# Patient Record
Sex: Female | Born: 1960 | Race: White | Hispanic: No | Marital: Married | Smoking: Former smoker
Health system: Southern US, Community
[De-identification: ages and names within clinical notes are randomized; demographics above are authoritative.]

## PROBLEM LIST (undated history)

## (undated) DIAGNOSIS — R700 Elevated erythrocyte sedimentation rate: Secondary | ICD-10-CM

## (undated) DIAGNOSIS — J45909 Unspecified asthma, uncomplicated: Secondary | ICD-10-CM

## (undated) DIAGNOSIS — D126 Benign neoplasm of colon, unspecified: Secondary | ICD-10-CM

## (undated) DIAGNOSIS — Z5189 Encounter for other specified aftercare: Secondary | ICD-10-CM

## (undated) DIAGNOSIS — N200 Calculus of kidney: Secondary | ICD-10-CM

## (undated) DIAGNOSIS — M8580 Other specified disorders of bone density and structure, unspecified site: Secondary | ICD-10-CM

## (undated) DIAGNOSIS — R238 Other skin changes: Secondary | ICD-10-CM

## (undated) DIAGNOSIS — D649 Anemia, unspecified: Secondary | ICD-10-CM

## (undated) DIAGNOSIS — T7840XA Allergy, unspecified, initial encounter: Secondary | ICD-10-CM

## (undated) DIAGNOSIS — Z862 Personal history of diseases of the blood and blood-forming organs and certain disorders involving the immune mechanism: Secondary | ICD-10-CM

## (undated) DIAGNOSIS — E54 Ascorbic acid deficiency: Secondary | ICD-10-CM

## (undated) DIAGNOSIS — Z87442 Personal history of urinary calculi: Secondary | ICD-10-CM

## (undated) DIAGNOSIS — IMO0002 Reserved for concepts with insufficient information to code with codable children: Secondary | ICD-10-CM

## (undated) DIAGNOSIS — Z8041 Family history of malignant neoplasm of ovary: Secondary | ICD-10-CM

## (undated) DIAGNOSIS — M8700 Idiopathic aseptic necrosis of unspecified bone: Secondary | ICD-10-CM

## (undated) DIAGNOSIS — B019 Varicella without complication: Secondary | ICD-10-CM

## (undated) DIAGNOSIS — M797 Fibromyalgia: Secondary | ICD-10-CM

## (undated) DIAGNOSIS — E559 Vitamin D deficiency, unspecified: Secondary | ICD-10-CM

## (undated) DIAGNOSIS — R233 Spontaneous ecchymoses: Secondary | ICD-10-CM

## (undated) DIAGNOSIS — Z9884 Bariatric surgery status: Secondary | ICD-10-CM

## (undated) DIAGNOSIS — L309 Dermatitis, unspecified: Secondary | ICD-10-CM

## (undated) DIAGNOSIS — E611 Iron deficiency: Secondary | ICD-10-CM

## (undated) DIAGNOSIS — K219 Gastro-esophageal reflux disease without esophagitis: Secondary | ICD-10-CM

## (undated) HISTORY — DX: Spontaneous ecchymoses: R23.3

## (undated) HISTORY — DX: Other specified disorders of bone density and structure, unspecified site: M85.80

## (undated) HISTORY — PX: APPENDECTOMY: SHX54

## (undated) HISTORY — DX: Dermatitis, unspecified: L30.9

## (undated) HISTORY — DX: Bariatric surgery status: Z98.84

## (undated) HISTORY — DX: Anemia, unspecified: D64.9

## (undated) HISTORY — DX: Reserved for concepts with insufficient information to code with codable children: IMO0002

## (undated) HISTORY — DX: Gastro-esophageal reflux disease without esophagitis: K21.9

## (undated) HISTORY — PX: DENTAL SURGERY: SHX609

## (undated) HISTORY — DX: Vitamin D deficiency, unspecified: E55.9

## (undated) HISTORY — DX: Iron deficiency: E61.1

## (undated) HISTORY — DX: Varicella without complication: B01.9

## (undated) HISTORY — DX: Unspecified asthma, uncomplicated: J45.909

## (undated) HISTORY — DX: Ascorbic acid deficiency: E54

## (undated) HISTORY — DX: Family history of malignant neoplasm of ovary: Z80.41

## (undated) HISTORY — DX: Encounter for other specified aftercare: Z51.89

## (undated) HISTORY — DX: Calculus of kidney: N20.0

## (undated) HISTORY — DX: Personal history of diseases of the blood and blood-forming organs and certain disorders involving the immune mechanism: Z86.2

## (undated) HISTORY — DX: Benign neoplasm of colon, unspecified: D12.6

## (undated) HISTORY — DX: Elevated erythrocyte sedimentation rate: R70.0

## (undated) HISTORY — DX: Allergy, unspecified, initial encounter: T78.40XA

## (undated) HISTORY — DX: Other skin changes: R23.8

## (undated) HISTORY — DX: Idiopathic aseptic necrosis of unspecified bone: M87.00

---

## 1995-12-07 HISTORY — PX: BREAST BIOPSY: SHX20

## 1998-12-06 HISTORY — PX: ABDOMINAL HYSTERECTOMY: SHX81

## 1999-07-29 ENCOUNTER — Inpatient Hospital Stay (HOSPITAL_COMMUNITY): Admission: RE | Admit: 1999-07-29 | Discharge: 1999-07-30 | Payer: Self-pay | Admitting: Obstetrics and Gynecology

## 1999-07-29 ENCOUNTER — Encounter (INDEPENDENT_AMBULATORY_CARE_PROVIDER_SITE_OTHER): Payer: Self-pay

## 2001-12-06 HISTORY — PX: GASTRIC BYPASS: SHX52

## 2001-12-06 HISTORY — PX: CHOLECYSTECTOMY: SHX55

## 2003-01-24 ENCOUNTER — Ambulatory Visit (HOSPITAL_COMMUNITY): Admission: RE | Admit: 2003-01-24 | Discharge: 2003-01-24 | Payer: Self-pay | Admitting: Gastroenterology

## 2003-12-20 ENCOUNTER — Ambulatory Visit (HOSPITAL_COMMUNITY): Admission: RE | Admit: 2003-12-20 | Discharge: 2003-12-20 | Payer: Self-pay | Admitting: Family Medicine

## 2005-11-16 ENCOUNTER — Other Ambulatory Visit: Admission: RE | Admit: 2005-11-16 | Discharge: 2005-11-16 | Payer: Self-pay | Admitting: Family Medicine

## 2008-07-20 ENCOUNTER — Emergency Department (HOSPITAL_BASED_OUTPATIENT_CLINIC_OR_DEPARTMENT_OTHER): Admission: EM | Admit: 2008-07-20 | Discharge: 2008-07-20 | Payer: Self-pay | Admitting: Emergency Medicine

## 2008-08-13 ENCOUNTER — Other Ambulatory Visit: Admission: RE | Admit: 2008-08-13 | Discharge: 2008-08-13 | Payer: Self-pay | Admitting: Family Medicine

## 2008-08-24 ENCOUNTER — Encounter: Admission: RE | Admit: 2008-08-24 | Discharge: 2008-08-24 | Payer: Self-pay | Admitting: Family Medicine

## 2009-09-10 ENCOUNTER — Emergency Department (HOSPITAL_BASED_OUTPATIENT_CLINIC_OR_DEPARTMENT_OTHER): Admission: EM | Admit: 2009-09-10 | Discharge: 2009-09-10 | Payer: Self-pay | Admitting: Emergency Medicine

## 2009-09-10 ENCOUNTER — Ambulatory Visit: Payer: Self-pay | Admitting: Diagnostic Radiology

## 2009-10-24 ENCOUNTER — Encounter: Admission: RE | Admit: 2009-10-24 | Discharge: 2009-10-24 | Payer: Self-pay | Admitting: Family Medicine

## 2009-12-06 HISTORY — PX: OTHER SURGICAL HISTORY: SHX169

## 2009-12-12 ENCOUNTER — Inpatient Hospital Stay (HOSPITAL_COMMUNITY): Admission: RE | Admit: 2009-12-12 | Discharge: 2009-12-15 | Payer: Self-pay | Admitting: Orthopedic Surgery

## 2010-12-28 ENCOUNTER — Encounter: Payer: Self-pay | Admitting: Family Medicine

## 2011-02-21 LAB — BASIC METABOLIC PANEL
CO2: 26 mEq/L (ref 19–32)
Calcium: 8.4 mg/dL (ref 8.4–10.5)
Chloride: 98 mEq/L (ref 96–112)
GFR calc Af Amer: >60 mL/min (ref >60)
Glucose, Bld: 131 mg/dL — ABNORMAL HIGH (ref 70–99)
Sodium: 134 mEq/L — ABNORMAL LOW (ref 135–145)

## 2011-02-21 LAB — PROTIME-INR
INR: 0.98 (ref 0.00–1.49)
INR: 1.09 (ref 0.00–1.49)
Prothrombin Time: 12.9 seconds (ref 11.6–15.2)
Prothrombin Time: 29.5 seconds — ABNORMAL HIGH (ref 11.6–15.2)

## 2011-02-21 LAB — URINALYSIS, ROUTINE W REFLEX MICROSCOPIC
Nitrite: NEGATIVE
Protein, ur: NEGATIVE mg/dL
Specific Gravity, Urine: 1.025 (ref 1.005–1.030)
Urobilinogen, UA: 0.2 mg/dL (ref 0.0–1.0)

## 2011-02-21 LAB — CBC
HCT: 28.6 % — ABNORMAL LOW (ref 36.0–46.0)
HCT: 28.9 % — ABNORMAL LOW (ref 36.0–46.0)
HCT: 41 % (ref 36.0–46.0)
Hemoglobin: 10.9 g/dL — ABNORMAL LOW (ref 12.0–15.0)
Hemoglobin: 13.9 g/dL (ref 12.0–15.0)
Hemoglobin: 9.8 g/dL — ABNORMAL LOW (ref 12.0–15.0)
MCHC: 33.8 g/dL (ref 30.0–36.0)
MCHC: 33.9 g/dL (ref 30.0–36.0)
MCHC: 34 g/dL (ref 30.0–36.0)
MCHC: 34.1 g/dL (ref 30.0–36.0)
MCV: 87.4 fL (ref 78.0–100.0)
MCV: 87.4 fL (ref 78.0–100.0)
MCV: 87.6 fL (ref 78.0–100.0)
MCV: 87.7 fL (ref 78.0–100.0)
Platelets: 167 10*3/uL (ref 150–400)
Platelets: 175 10*3/uL (ref 150–400)
RBC: 3.68 MIL/uL — ABNORMAL LOW (ref 3.87–5.11)
RDW: 13.8 % (ref 11.5–15.5)
RDW: 14.3 % (ref 11.5–15.5)
RDW: 14.3 % (ref 11.5–15.5)
RDW: 14.4 % (ref 11.5–15.5)

## 2011-02-21 LAB — DIFFERENTIAL
Lymphocytes Relative: 31 % (ref 12–46)
Lymphs Abs: 3.3 10*3/uL (ref 0.7–4.0)
Monocytes Relative: 5 % (ref 3–12)
Neutro Abs: 6.4 10*3/uL (ref 1.7–7.7)
Neutrophils Relative %: 61 % (ref 43–77)

## 2011-02-21 LAB — COMPREHENSIVE METABOLIC PANEL
BUN: 9 mg/dL (ref 6–23)
Calcium: 9.3 mg/dL (ref 8.4–10.5)
Creatinine, Ser: 0.65 mg/dL (ref 0.4–1.2)
GFR calc non Af Amer: >60 mL/min (ref >60)
Glucose, Bld: 101 mg/dL — ABNORMAL HIGH (ref 70–99)
Sodium: 137 mEq/L (ref 135–145)
Total Protein: 6.8 g/dL (ref 6.0–8.3)

## 2011-02-21 LAB — TYPE AND SCREEN
ABO/RH(D): A POS
Antibody Screen: NEGATIVE

## 2011-02-21 LAB — APTT: aPTT: 31 seconds (ref 24–37)

## 2011-04-23 NOTE — Op Note (Signed)
   NAME:  Karla [name], Karla [name]                          ACCOUNT NO.:  [id number]   MEDICAL RECORD NO.:  [id number]                   PATIENT TYPE:  AMB   LOCATION:  ENDO                                 FACILITY:  [facility]   PHYSICIAN:  James L. Malon Kindle., M.D.          DATE OF BIRTH:  02-Feb-1961   DATE OF PROCEDURE:  01/24/2003  DATE OF DISCHARGE:                                 OPERATIVE REPORT   PROCEDURE:  Colonoscopy.   MEDICATIONS:  Fentanyl 100 mcg, Versed 10 mg IV.   INDICATIONS:  Heme-positive stool and abdominal pain in a woman who has had  previous bypass surgery.   DESCRIPTION OF PROCEDURE:  The procedure had been explained to the patient  and consent obtained.  With the patient in the left lateral decubitus  position, the Olympus scope was inserted and advanced under direct  visualization.  The prep was excellent.  We were able to reach the cecum  without difficulty.  The ileocecal valve and appendiceal orifice were seen.  The scope was withdrawn from the cecum.  The ascending colon, hepatic  flexure, transverse colon, descending, and sigmoid colon were seen well.  No  diverticular disease and no polyps were seen throughout, and no gross  lesions were seen throughout the entire colon.  The rectum was free of  polyps or other lesions.  The patient tolerated the procedure well and was  maintained on low-flow oxygen and pulse oximeter throughout the procedure.   ASSESSMENT:  Normal colonoscopy in a woman with heme-positive stool and  abdominal pain.   PLAN:  Will give her Aciphex empirically and obtain an upper GI and small  bowel follow through.  We will see her back in six weeks.                                               James L. Malon Kindle., M.D.    Waldron Session  D:  01/24/2003  T:  01/24/2003  Job:  [id number]   cc:   Stacie Acres. White, M.D.  [address]  [place]  [place] [address]  Fax: [phone]   Llana Aliment. Malon Kindle., M.D.  [address]  [place]  [place] [address]  Fax: [phone]

## 2012-04-05 ENCOUNTER — Emergency Department (INDEPENDENT_AMBULATORY_CARE_PROVIDER_SITE_OTHER): Payer: Managed Care, Other (non HMO)

## 2012-04-05 ENCOUNTER — Emergency Department (HOSPITAL_BASED_OUTPATIENT_CLINIC_OR_DEPARTMENT_OTHER)
Admission: EM | Admit: 2012-04-05 | Discharge: 2012-04-06 | Disposition: A | Discharge status: Home / Self Care | Payer: Managed Care, Other (non HMO) | Attending: Emergency Medicine | Admitting: Emergency Medicine

## 2012-04-05 ENCOUNTER — Encounter (HOSPITAL_BASED_OUTPATIENT_CLINIC_OR_DEPARTMENT_OTHER): Payer: Self-pay | Admitting: *Deleted

## 2012-04-05 DIAGNOSIS — N2889 Other specified disorders of kidney and ureter: Secondary | ICD-10-CM

## 2012-04-05 DIAGNOSIS — R1032 Left lower quadrant pain: Secondary | ICD-10-CM | POA: Insufficient documentation

## 2012-04-05 DIAGNOSIS — IMO0001 Reserved for inherently not codable concepts without codable children: Secondary | ICD-10-CM | POA: Insufficient documentation

## 2012-04-05 DIAGNOSIS — Z87891 Personal history of nicotine dependence: Secondary | ICD-10-CM | POA: Insufficient documentation

## 2012-04-05 DIAGNOSIS — R9389 Abnormal findings on diagnostic imaging of other specified body structures: Secondary | ICD-10-CM

## 2012-04-05 DIAGNOSIS — Z87442 Personal history of urinary calculi: Secondary | ICD-10-CM | POA: Insufficient documentation

## 2012-04-05 DIAGNOSIS — Z9079 Acquired absence of other genital organ(s): Secondary | ICD-10-CM | POA: Insufficient documentation

## 2012-04-05 DIAGNOSIS — N23 Unspecified renal colic: Secondary | ICD-10-CM | POA: Insufficient documentation

## 2012-04-05 DIAGNOSIS — Z9884 Bariatric surgery status: Secondary | ICD-10-CM | POA: Insufficient documentation

## 2012-04-05 HISTORY — DX: Fibromyalgia: M79.7

## 2012-04-05 HISTORY — DX: Calculus of kidney: N20.0

## 2012-04-05 LAB — URINE MICROSCOPIC-ADD ON

## 2012-04-05 LAB — URINALYSIS, ROUTINE W REFLEX MICROSCOPIC
Bilirubin Urine: NEGATIVE
Glucose, UA: NEGATIVE mg/dL
Ketones, ur: NEGATIVE mg/dL
Protein, ur: NEGATIVE mg/dL
Urobilinogen, UA: 0.2 mg/dL (ref 0.0–1.0)

## 2012-04-05 MED ORDER — SODIUM CHLORIDE 0.9 % IV SOLN
INTRAVENOUS | Status: DC
  Administered 2012-04-05: 23:00:00 via INTRAVENOUS

## 2012-04-05 MED ORDER — HYDROMORPHONE HCL PF 1 MG/ML IJ SOLN
1.0000 mg | Freq: Once | INTRAMUSCULAR | Status: AC
  Administered 2012-04-05: 1 mg via INTRAVENOUS
  Filled 2012-04-05: qty 1

## 2012-04-05 MED ORDER — ONDANSETRON HCL 4 MG/2ML IJ SOLN
4.0000 mg | Freq: Once | INTRAMUSCULAR | Status: AC
  Administered 2012-04-05: 4 mg via INTRAVENOUS
  Filled 2012-04-05: qty 2

## 2012-04-05 MED ORDER — KETOROLAC TROMETHAMINE 15 MG/ML IJ SOLN
15.0000 mg | Freq: Once | INTRAMUSCULAR | Status: AC
  Administered 2012-04-05: 15 mg via INTRAVENOUS
  Filled 2012-04-05: qty 1

## 2012-04-05 NOTE — ED Notes (Signed)
Patient transported to CT 

## 2012-04-05 NOTE — ED Notes (Signed)
MD at bedside. 

## 2012-04-05 NOTE — ED Notes (Signed)
Sudden onset of LLQ abdominal cramping x58min. Pt had a bowel movement x2 without relief and multiple episodes of vomiting tonight. Denies urinary or vaginal symptoms. Denies fevers.

## 2012-04-05 NOTE — ED Provider Notes (Signed)
History     CSN: [id number]  Arrival date & time 04/05/12  2129   First MD Initiated Contact with Patient 04/05/12 2309      Chief Complaint  Patient presents with  . Abdominal Pain    (Consider location/radiation/quality/duration/timing/severity/associated sxs/prior treatment) HPI Is a 51 year old white female with about a four-hour history of left lower abdominal cramping. The pain was severe at its worst but is less severe now. It is similar to previous ureteral colic. It is been accompanied by several episodes of emesis. She has moved her bowels and taken Pepto-Bismol without relief. She denies vaginal bleeding or dysuria.  Past Medical History  Diagnosis Date  . Renal disorder   . Fibromyalgia     Past Surgical History  Procedure Date  . Left knee replacement   . Gastric bypass   . Abdominal hysterectomy     No family history on file.  History  Substance Use Topics  . Smoking status: Former Games developer  . Smokeless tobacco: Not on file  . Alcohol Use: No    OB History    Grav Para Term Preterm Abortions TAB SAB Ect Mult Living                  Review of Systems  All other systems reviewed and are negative.    Allergies  Sodium pentobarbital  Home Medications   Current Outpatient Rx  Name Route Sig Dispense Refill  . IBUPROFEN 800 MG PO TABS Oral Take 800 mg by mouth every 8 (eight) hours as needed. Patient uses this medication for pain.    Marland Kitchen ONE-DAILY MULTI VITAMINS PO TABS Oral Take 1 tablet by mouth daily.      BP 129/76  Pulse 66  Temp(Src) 97.5 F (36.4 C) (Oral)  Resp 18  Ht 5\' 5"  (1.651 m)  Wt 230 lb (104.327 kg)  BMI 38.27 kg/m2  SpO2 99%  Physical Exam General: Well-developed, well-nourished female in no acute distress; appearance consistent with age of record HENT: normocephalic, atraumatic Eyes: pupils equal round and reactive to light; extraocular muscles intact Neck: supple Heart: regular rate and rhythm; no murmurs, rubs or  gallops Lungs: clear to auscultation bilaterally Abdomen: soft; nondistended; nontender; bowel sounds present GU: no CVA tenderness Extremities: No deformity; full range of motion; pulses normal; no edema Neurologic: Awake, alert and oriented; motor function intact in all extremities and symmetric; no facial droop Skin: Warm and dry Psychiatric: Normal mood and affect    ED Course  Procedures (including critical care time)     MDM   Nursing notes and vitals signs, including pulse oximetry, reviewed.  Summary of this visit's results, reviewed by myself:  Labs:  Results for orders placed during the hospital encounter of 04/05/12  URINALYSIS, ROUTINE W REFLEX MICROSCOPIC      Component Value Range   Color, Urine YELLOW  YELLOW    APPearance CLOUDY (*) CLEAR    Specific Gravity, Urine 1.023  1.005 - 1.030    pH 5.5  5.0 - 8.0    Glucose, UA NEGATIVE  NEGATIVE (mg/dL)   Hgb urine dipstick LARGE (*) NEGATIVE    Bilirubin Urine NEGATIVE  NEGATIVE    Ketones, ur NEGATIVE  NEGATIVE (mg/dL)   Protein, ur NEGATIVE  NEGATIVE (mg/dL)   Urobilinogen, UA 0.2  0.0 - 1.0 (mg/dL)   Nitrite NEGATIVE  NEGATIVE    Leukocytes, UA NEGATIVE  NEGATIVE   URINE MICROSCOPIC-ADD ON      Component Value Range  Squamous Epithelial / LPF FEW (*) RARE    RBC / HPF 11-20  <3 (RBC/hpf)   Bacteria, UA RARE  RARE    Ct Abdomen Pelvis Wo Contrast  04/06/2012  *RADIOLOGY REPORT*  Clinical Data: Left lower quadrant abdominal pain, history of renal stones, history of hysterectomy, appendectomy gastric bypass  CT ABDOMEN AND PELVIS WITHOUT CONTRAST  Technique:  Multidetector CT imaging of the abdomen and pelvis was performed following the standard protocol without intravenous contrast.  Comparison: Abdominal CT - 07/20/2008  Findings:  The lack of intravenous contrast limits the ability to evaluate solid abdominal organs.  Normal hepatic contour.  Post cholecystectomy.  Grossly unchanged dilatation of the common  bile duct measuring approximately 14 mm in diameter, presumably due to post cholecystectomy state.  No ascites.  There is a minimal amount of asymmetric left-sided perinephric stranding (image 33, series 2) and mild ureterectasis (images 42- 45) without associated radiopaque renal stone or stone.  There is mild dilatation of the left renal collecting system without definite hydronephrosis. There is a punctate (approximate 1 mm) structure lying within the dependent portion of the urinary bladder, not deftly seen on prior examination may represent a recently passed renal stone.  No right-sided renal stones urinary obstructions.  No stones are seen within the urinary bladder. Normal noncontrast appearance of the bilateral adrenal glands and pancreas. Grossly unchanged approximately 1.5 cm hypoattenuating lesion of the posterior aspect of the spleen (image 35), possibly a splenic cyst.   Incidental note is made of a small splenule.  Postsurgical changes of the upper abdomen compatible with provided history of gastric bypass.  No definite evidence of enteric obstruction.  The appendix is not visualized compatible with provided history of prior appendectomy.  No pneumoperitoneum, pneumatosis or portal venous gas.  Normal caliber abdominal aorta. Shoddy retroperitoneal lymph nodes are not enlarged by CT criteria. Note is made of a solitary enlarged mesenteric lymph node measuring approximate 1.3 cm in grade short axis diameter (image 44). Additional shoddy mesenteric nodes are enlarged by CT criteria.  No pelvic or inguinal lymphadenopathy.  Post hysterectomy.  No free fluid in the pelvis.  Limited visualization of the lower thorax demonstrates minimal atelectasis with the medial aspect of the right lower lobe.  No pleural effusion.  No focal airspace opacities.  Normal heart size.  No pericardial effusion.  No acute or aggressive osseous abnormalities.  Lumbar spine degenerative change, worse at L5 and S1  IMPRESSION:  1.   Punctate (1 mm) opacity within the dependent portion of the urinary bladder likely represents a recently passed renal stone. This finding is associated with mild asymmetric left-sided perinephric stranding, mild ureterectasis and fullness of the left renal collecting system.  Correlation with urinalysis is recommended.  2.  Stable sequela of gastric bypass without definite evidence of enteric obstruction.  3.  Post cholecystectomy with stable dilatation of the common bile duct. 4.  Shoddy mesenteric lymph nodes, nonspecific, likely reactive in etiology.  Original Report Authenticated By: Waynard Reeds, M.D.   12:18 AM Pain improved. Advised of findings of stone passed in bladder.         Hanley Seamen, MD 04/06/12 [phone]

## 2012-04-06 MED ORDER — OXYCODONE-ACETAMINOPHEN 5-325 MG PO TABS: 2.0000 | ORAL_TABLET | ORAL | Status: AC | PRN

## 2012-04-06 MED ORDER — NAPROXEN 250 MG PO TABS
500.0000 mg | ORAL_TABLET | Freq: Once | ORAL | Status: AC
  Administered 2012-04-06: 500 mg via ORAL
  Filled 2012-04-06: qty 2

## 2012-04-06 NOTE — Discharge Instructions (Signed)
Ureteral Colic (Kidney Stones) Ureteral colic is the result of a condition when kidney stones form inside the kidney. Once kidney stones are formed they may move into the tube that connects the kidney with the bladder (ureter). If this occurs, this condition may cause pain (colic) in the ureter.  CAUSES  Pain is caused by stone movement in the ureter and the obstruction caused by the stone. SYMPTOMS  The pain comes and goes as the ureter contracts around the stone. The pain is usually intense, sharp, and stabbing in character. The location of the pain may move as the stone moves through the ureter. When the stone is near the kidney the pain is usually located in the back and radiates to the belly (abdomen). When the stone is ready to pass into the bladder the pain is often located in the lower abdomen on the side the stone is located. At this location, the symptoms may mimic those of a urinary tract infection with urinary frequency. Once the stone is located here it often passes into the bladder and the pain disappears completely. TREATMENT   Your caregiver will provide you with medicine for pain relief.   You may require specialized follow-up X-rays.   The absence of pain does not always mean that the stone has passed. It may have just stopped moving. If the urine remains completely obstructed, it can cause loss of kidney function or even complete destruction of the involved kidney. It is your responsibility and in your interest that X-rays and follow-ups as suggested by your caregiver are completed. Relief of pain without passage of the stone can be associated with severe damage to the kidney, including loss of kidney function on that side.   If your stone does not pass on its own, additional measures may be taken by your caregiver to ensure its removal.  HOME CARE INSTRUCTIONS   Increase your fluid intake. Water is the preferred fluid since juices containing vitamin C may acidify the urine making  it less likely for certain stones (uric acid stones) to pass.   Strain all urine. A strainer will be provided. Keep all particulate matter or stones for your caregiver to inspect.   Take your pain medicine as directed.   Make a follow-up appointment with your caregiver as directed.   Remember that the goal is passage of your stone. The absence of pain does not mean the stone is gone. Follow your caregiver's instructions.   Only take over-the-counter or prescription medicines for pain, discomfort, or fever as directed by your caregiver.  SEEK MEDICAL CARE IF:   Pain cannot be controlled with the prescribed medicine.   You have a fever.   Pain continues for longer than your caregiver advises it should.   There is a change in the pain, and you develop chest discomfort or constant abdominal pain.   You feel faint or pass out.  MAKE SURE YOU:   Understand these instructions.   Will watch your condition.   Will get help right away if you are not doing well or get worse.  Document Released: 09/01/2005 Document Revised: 11/11/2011 Document Reviewed: 05/19/2011 [employer] Patient Information 2012 [employer]. 

## 2012-12-13 DIAGNOSIS — Z0000 Encounter for general adult medical examination without abnormal findings: Secondary | ICD-10-CM | POA: Insufficient documentation

## 2013-07-18 ENCOUNTER — Ambulatory Visit (INDEPENDENT_AMBULATORY_CARE_PROVIDER_SITE_OTHER): Payer: Managed Care, Other (non HMO) | Admitting: Physician Assistant

## 2013-07-18 ENCOUNTER — Encounter: Payer: Self-pay | Admitting: Physician Assistant

## 2013-07-18 VITALS — BP 114/82 | HR 57 | Temp 98.1°F | Resp 16 | Ht 64.5 in | Wt 246.2 lb

## 2013-07-18 DIAGNOSIS — G5621 Lesion of ulnar nerve, right upper limb: Secondary | ICD-10-CM | POA: Insufficient documentation

## 2013-07-18 DIAGNOSIS — R062 Wheezing: Secondary | ICD-10-CM

## 2013-07-18 DIAGNOSIS — K219 Gastro-esophageal reflux disease without esophagitis: Secondary | ICD-10-CM

## 2013-07-18 DIAGNOSIS — J069 Acute upper respiratory infection, unspecified: Secondary | ICD-10-CM

## 2013-07-18 DIAGNOSIS — Z0000 Encounter for general adult medical examination without abnormal findings: Secondary | ICD-10-CM | POA: Insufficient documentation

## 2013-07-18 DIAGNOSIS — Z8739 Personal history of other diseases of the musculoskeletal system and connective tissue: Secondary | ICD-10-CM | POA: Insufficient documentation

## 2013-07-18 DIAGNOSIS — G5620 Lesion of ulnar nerve, unspecified upper limb: Secondary | ICD-10-CM

## 2013-07-18 DIAGNOSIS — J209 Acute bronchitis, unspecified: Secondary | ICD-10-CM | POA: Insufficient documentation

## 2013-07-18 MED ORDER — PREDNISONE 20 MG PO TABS: 40.0000 mg | ORAL_TABLET | Freq: Every day | ORAL | Status: DC

## 2013-07-18 MED ORDER — FLUTICASONE PROPIONATE 50 MCG/ACT NA SUSP: 2.0000 | Freq: Every day | NASAL | Status: DC

## 2013-07-18 NOTE — Assessment & Plan Note (Signed)
Managed by Rheumatology.  Has appointment with Neurology.

## 2013-07-18 NOTE — Assessment & Plan Note (Signed)
Advised Ibuprofen or Aleve.  Avoid overuse of R elbow and wrist.  Do not sleep on R side.  Wrist support at work.  Call or return if symptoms persist or worsen.

## 2013-07-18 NOTE — Assessment & Plan Note (Signed)
Recent labs at previous PCP were obtained from patient and are to be scanned in medical record.  Patient is due for Mammogram in 2015.  Patient to schedule appointment for Papanicolaou smear in near future.  Encourage annual visit to Optometrist and annual/biannual visits with Dentist.

## 2013-07-18 NOTE — Assessment & Plan Note (Signed)
Most likely viral.  Burst of prednisone given for wheezing.  Rest/Fluids.  Daily Claritin or Zyrtec.  Rx refill for Flonase.  Encourage probiotics.  Saline nasal spray.

## 2013-07-18 NOTE — Assessment & Plan Note (Signed)
Controlled with Rx Dexilant.

## 2013-07-18 NOTE — Patient Instructions (Signed)
Numbness/Tingling -- Please take NSAIDs as needed for numbness and tingling.  Please avoid overuse of R arm if possible.  Avoid sleeping on R side.  Place support under wrist when typing.    Upper Respiratory Symptoms -- Prednisone prescription given for 3 day course to help with wheezing. Take a daily claritin or zyrtec.  Use flonase as directed. If symptoms persist or worsen, please return to clinic.   Cubital Tunnel Syndrome (Ulnar Neuritis) Cubital tunnel syndrome is a disorder of the nervous system of the elbow and upper arm the causes pain, tingling, weakness of the hand, or the loss of feeling in the ring and little fingers. The disorder is caused by a compression or stretching of the ulnar nerve at the elbow and in the forearm by muscles or ligament-like tissues. The ulnar nerve is susceptible to injury because it has little tissue that protects it at the elbow. The lack of protection increases the risk of injury. Cubital tunnel syndrome may decrease athletic performance in sports that require strong hand or wrist actions (tennis or racquetball).  SYMPTOMS   Clumsiness or weakness of the hand.  Poor dexterity (fine hand function).  Tenderness of the inner elbow.  Aching or soreness of the inner elbow.  Increased pain with forced full-elbow bending.  Reduced control with throwing, such as pitching.  Tingling, numbness, or burning inside the forearm or in part of the hand or fingers (especially the little finger or ring finger).  Sharp pains that shoot from the elbow down to the wrist and hand.  A weak grip, especially power grip, and a weak pinch.  Reduced performance in sports that require a strong grip. CAUSES   Increased pressure on the ulnar nerve at the elbow, arm, or forearm caused by swollen, inflamed, or scarred tissues; ligament-like; or between muscles.  Stretching of the nerve due to loose elbow ligaments.  Trauma to the nerve at the elbow.  Repetitive elbow  bending. RISK INCREASES WITH:  Poor strength or flexibility.  Inadequate warm-up properly physical activity.  Diabetes mellitus.  under-active thyroid gland(hypothyroidism).  Repetitive and/or strenuous throwing motions such as baseball and javelin throwing.  Contact sports (football, soccer, rugby, or lacrosse).  Other elbow conditions (medial epicondylitis or loose inner elbow ligaments). PREVENTION  Warm up and stretch properly before activity.  Maintain physical fitness:  Wrist, forearm, and elbow flexibility.  Muscle strength and endurance.  Cardiovascular fitness.  Wear proper protective equipment, including elbow pads.  Learn and use proper throwing techniques. PROGNOSIS  Cubital tunnel syndrome is typically curable is treated appropriately. Is some cases the condition may heal without treatment. If the muscle begins to waste or the nerve damage worsens, surgery may be necessary. RELATED COMPLICATIONS   Permanent numbness and weakness of the ring and little fingers.  Weak grip.  Permanent paralysis of some hand and finger muscles.  Risks associated with surgery, including infection, bleeding, injury to nerves (including the ulnar nerve), recurrent or continued symptoms, and elbow stiffness. TREATMENT  Treatment initially involves stopping the activities that cause the symptoms to worsen. Medications and ice can be used to reduce pain in inflammation. Splinting and protecting the elbow with padding (especially at night) to prevent full bending of the elbow may help. Stretching and strengthening exercises of the muscles of the forearm and elbow are important, and they may be performed at home or with the assistance of a therapist. If conservative treatment is not successful, surgery may be necessary to reduce compression of the  nerve. Before return to sport, assessment for proper throwing and hitting mechanics is important.  MEDICATION   If pain medication is  necessary, nonsteroidal anti-inflammatory medications, such as aspirin and ibuprofen, or other minor pain relievers, such as acetaminophen, are often recommended. Contact your caregiver immediately if any bleeding, stomach upset, or signs of an allergic reaction occur.  Prescription pain relievers are usually only prescribed after surgery. Use only as directed and only as much as you need. COLD THERAPY   Cold treatment (icing) relieves pain and reduces inflammation. Cold treatment should be applied for 10 to 15 minutes every 2 to 3 hours for inflammation and pain and immediately after any activity that aggravates your symptoms. Use ice packs or an ice massage. SEEK MEDICAL CARE IF:   Symptoms get worse or do not improve in 2 weeks despite treatment.  You experience pain, numbness, or coldness in the hand.  Blue, gray, or dark color appears in the fingernails.  Any of the following occur after surgery: increased pain, swelling, redness, drainage, or bleeding in the surgical area or signs of infection.  New, unexplained symptoms develop (drugs used in treatment may produce side effects). Document Released: 11/22/2005 Document Revised: 02/14/2012 Document Reviewed: 03/06/2009 [facility] Patient Information 2014 [place], [place].

## 2013-07-18 NOTE — Progress Notes (Signed)
Patient ID: Karla [name], female   DOB: February 04, 1961, 52 y.o.   MRN: [id number]  Patient is a 52 year-old caucasian female who presents today to establish care.  Health Maintenance: (1) Mammography -- last mammogram in 2013.  Due in 2015. (2) Pap Smear -- last pap smear 5 years ago.  Overdue.  Denies history of abnormal pap smear.  Does not see OB/GYN.  Acute Concerns: (1) Numbness and tingling of ring and pinky fingers intermittently over past week. Denies ever having this problem before.  Uses computer a lot at work.  Notes occasional loss of grip strength.  Denies similar symptoms in L hand.  Patient is R handed.  No hx of trauma.  Denies pain at elbow.  Denies pain at wrist.  Denies decrease in range of motion.  (2) Patient endorses non-productive cough, wheezing, shortness of breath for 5 days.  Denies fever, chills, rash.  Denies N/V/D/C. Denies hx of sinus infection. Endorses some fatigue for about 1 week.  Denies ear pain, ear discharge, sinus pressure or pain.  Chronic Issues: (1) GERD -- controlled with Dexilant.  Occasional breakthrough symptoms with late-night meal for which she adds a Tums or zantac.  (2) Fibromyalgia  -- Is followed by rheumatology.  Has upcoming appointment with neurology.   Past Medical History  Diagnosis Date  . Fibromyalgia   . Kidney stone   . Childhood asthma   . Chicken pox    Current Outpatient Prescriptions on File Prior to Visit  Medication Sig Dispense Refill  . ibuprofen (ADVIL,MOTRIN) 800 MG tablet Take 800 mg by mouth every 8 (eight) hours as needed. Patient uses this medication for pain.      . Multiple Vitamin (MULTIVITAMIN) tablet Take 1 tablet by mouth daily.       No current facility-administered medications on file prior to visit.   Allergies  Allergen Reactions  . Sodium Pentobarbital [Pentobarbital] Other (See Comments)    Severe nausea.   Family History  Problem Relation Age of Onset  . Asthma Mother     Deceased  .  Cancer - Other Mother     Esophageal  . Other Mother 2    Ruptured Bowel  . GER disease Mother   . Diabetes Father     Deceased  . Heart attack Father   . Throat cancer Maternal Grandmother     Deceased  . Ovarian cancer Maternal Aunt     Deceased  . Healthy Brother   . Arthritis/Rheumatoid Daughter   . Psoriasis Daughter    History   Social History  . Marital Status: Married    Spouse Name: N/A    Number of Children: N/A  . Years of Education: N/A   Social History Main Topics  . Smoking status: Former Games developer  . Smokeless tobacco: None  . Alcohol Use: No  . Drug Use: No  . Sexual Activity: Yes    Birth Control/ Protection: Surgical   Other Topics Concern  . None   Social History Narrative  . None   Review of Systems  Constitutional: Negative for fever, chills, weight loss and malaise/fatigue.  HENT: Positive for congestion. Negative for hearing loss, ear pain, sore throat and tinnitus.   Eyes: Negative for blurred vision, double vision, photophobia and pain.  Respiratory: Positive for cough, shortness of breath and wheezing. Negative for hemoptysis and sputum production.   Cardiovascular: Negative for chest pain and palpitations.  Gastrointestinal: Positive for heartburn. Negative for nausea, vomiting, abdominal pain, diarrhea,  constipation, blood in stool and melena.  Genitourinary: Negative for dysuria, urgency, frequency, hematuria and flank pain.  Musculoskeletal: Positive for myalgias. Negative for falls.  Skin: Negative for rash.  Neurological: Positive for tingling and headaches. Negative for dizziness, sensory change and loss of consciousness.  Endo/Heme/Allergies: Positive for environmental allergies. Does not bruise/bleed easily.  Psychiatric/Behavioral: Negative for depression, suicidal ideas and substance abuse. The patient does not have insomnia.     Filed Vitals:   07/18/13 1439  BP: 114/82  Pulse: 57  Temp: 98.1 F (36.7 C)  TempSrc: Oral   Resp: 16  Height: 5' 4.5" (1.638 m)  Weight: 246 lb 4 oz (111.698 kg)  SpO2: 98%   Physical Exam  Constitutional: She is oriented to person, place, and time and well-developed, well-nourished, and in no distress.  HENT:  Head: Normocephalic and atraumatic.  Right Ear: External ear normal.  Left Ear: External ear normal.  Nose: Nose normal.  Mouth/Throat: Oropharynx is clear and moist. No oropharyngeal exudate.  R TM with presence of fluid behind ear drum.  No erythema or bulging of TM.  L TM WNL  Eyes: Conjunctivae and EOM are normal. Pupils are equal, round, and reactive to light.  Neck: Normal range of motion. Neck supple. No thyromegaly present.  Cardiovascular: Normal rate, regular rhythm, normal heart sounds and intact distal pulses.   No murmur heard. Pulmonary/Chest: Effort normal. No respiratory distress. She has wheezes. She has no rales. She exhibits no tenderness.  Abdominal: Soft. Bowel sounds are normal. She exhibits no distension and no mass. There is no tenderness. There is no rebound and no guarding.  Musculoskeletal: Normal range of motion.  Lymphadenopathy:    She has no cervical adenopathy.  Neurological: She is alert and oriented to person, place, and time. She has normal reflexes. No cranial nerve deficit.  Skin: Skin is warm and dry. No rash noted.   Assessment/Plan: Cubital tunnel syndrome on right Advised Ibuprofen or Aleve.  Avoid overuse of R elbow and wrist.  Do not sleep on R side.  Wrist support at work.  Call or return if symptoms persist or worsen.  GERD (gastroesophageal reflux disease) Controlled with Rx Dexilant.  Acute bronchitis Most likely viral.  Burst of prednisone given for wheezing.  Rest/Fluids.  Daily Claritin or Zyrtec.  Rx refill for Flonase.  Encourage probiotics.  Saline nasal spray.  H/O fibromyalgia Managed by Rheumatology.  Has appointment with Neurology.  Encounter for preventive health examination Recent labs at previous  PCP were obtained from patient and are to be scanned in medical record.  Patient is due for Mammogram in 2015.  Patient to schedule appointment for Papanicolaou smear in near future.  Encourage annual visit to Optometrist and annual/biannual visits with Dentist.

## 2013-08-10 ENCOUNTER — Other Ambulatory Visit: Payer: Managed Care, Other (non HMO)

## 2013-08-10 ENCOUNTER — Ambulatory Visit (INDEPENDENT_AMBULATORY_CARE_PROVIDER_SITE_OTHER): Payer: Managed Care, Other (non HMO) | Admitting: Gastroenterology

## 2013-08-10 ENCOUNTER — Encounter: Payer: Self-pay | Admitting: Gastroenterology

## 2013-08-10 ENCOUNTER — Other Ambulatory Visit (INDEPENDENT_AMBULATORY_CARE_PROVIDER_SITE_OTHER): Payer: Managed Care, Other (non HMO)

## 2013-08-10 VITALS — BP 120/70 | HR 72 | Ht 64.5 in | Wt 244.1 lb

## 2013-08-10 DIAGNOSIS — R142 Eructation: Secondary | ICD-10-CM

## 2013-08-10 DIAGNOSIS — M2550 Pain in unspecified joint: Secondary | ICD-10-CM

## 2013-08-10 DIAGNOSIS — Z1211 Encounter for screening for malignant neoplasm of colon: Secondary | ICD-10-CM

## 2013-08-10 DIAGNOSIS — Z800 Family history of malignant neoplasm of digestive organs: Secondary | ICD-10-CM

## 2013-08-10 DIAGNOSIS — D649 Anemia, unspecified: Secondary | ICD-10-CM

## 2013-08-10 DIAGNOSIS — R141 Gas pain: Secondary | ICD-10-CM

## 2013-08-10 DIAGNOSIS — K219 Gastro-esophageal reflux disease without esophagitis: Secondary | ICD-10-CM

## 2013-08-10 DIAGNOSIS — Z9884 Bariatric surgery status: Secondary | ICD-10-CM

## 2013-08-10 LAB — HEPATIC FUNCTION PANEL
ALT: 33 U/L (ref 0–35)
AST: 26 U/L (ref 0–37)
Albumin: 3.9 g/dL (ref 3.5–5.2)
Total Bilirubin: 0.4 mg/dL (ref 0.3–1.2)

## 2013-08-10 LAB — BASIC METABOLIC PANEL
CO2: 26 mEq/L (ref 19–32)
Chloride: 106 mEq/L (ref 96–112)
Creatinine, Ser: 0.6 mg/dL (ref 0.4–1.2)
Potassium: 4.1 mEq/L (ref 3.5–5.1)
Sodium: 138 mEq/L (ref 135–145)

## 2013-08-10 LAB — CBC WITH DIFFERENTIAL/PLATELET
Basophils Absolute: 0.1 10*3/uL (ref 0.0–0.1)
Eosinophils Absolute: 0.2 10*3/uL (ref 0.0–0.7)
Hemoglobin: 12.8 g/dL (ref 12.0–15.0)
Lymphocytes Relative: 30.5 % (ref 12.0–46.0)
MCHC: 32.7 g/dL (ref 30.0–36.0)
Monocytes Relative: 2.2 % — ABNORMAL LOW (ref 3.0–12.0)
Neutrophils Relative %: 64.2 % (ref 43.0–77.0)
RDW: 15 % — ABNORMAL HIGH (ref 11.5–14.6)

## 2013-08-10 LAB — TSH: TSH: 1.47 u[IU]/mL (ref 0.35–5.50)

## 2013-08-10 LAB — IBC PANEL
Iron: 40 ug/dL — ABNORMAL LOW (ref 42–145)
Saturation Ratios: 7.8 % — ABNORMAL LOW (ref 20.0–50.0)
Transferrin: 365.4 mg/dL — ABNORMAL HIGH (ref 212.0–360.0)

## 2013-08-10 LAB — VITAMIN B12: Vitamin B-12: 1184 pg/mL — ABNORMAL HIGH (ref 211–911)

## 2013-08-10 LAB — SEDIMENTATION RATE: Sed Rate: 45 mm/hr — ABNORMAL HIGH (ref 0–22)

## 2013-08-10 LAB — FERRITIN: Ferritin: 11.1 ng/mL (ref 10.0–291.0)

## 2013-08-10 LAB — FOLATE: Folate: >24.8 ng/mL (ref >5.9)

## 2013-08-10 MED ORDER — NA SULFATE-K SULFATE-MG SULF 17.5-3.13-1.6 GM/177ML PO SOLN: ORAL | Status: DC

## 2013-08-10 NOTE — Patient Instructions (Addendum)
  You have been scheduled for an endoscopy and colonoscopy with propofol. Please follow the written instructions given to you at your visit today. Please pick up your prep at the pharmacy within the next 1-3 days. If you use inhalers (even only as needed), please bring them with you on the day of your procedure. Your physician has requested that you go to www.startemmi.com and enter the access code given to you at your visit today. This web site gives a general overview about your procedure. However, you should still follow specific instructions given to you by our office regarding your preparation for the procedure.  Your physician has requested that you go to the basement for lab work before leaving today. _____________________________________________________________________________________________                                               We are excited to introduce MyChart, a new best-in-class service that provides you online access to important information in your electronic medical record. We want to make it easier for you to view your health information - all in one secure location - when and where you need it. We expect MyChart will enhance the quality of care and service we provide.  When you register for MyChart, you can:    View your test results.    Request appointments and receive appointment reminders via email.    Request medication renewals.    View your medical history, allergies, medications and immunizations.    Communicate with your physician's office through a password-protected site.    Conveniently print information such as your medication lists.  To find out if MyChart is right for you, please talk to a member of our clinical staff today. We will gladly answer your questions about this free health and wellness tool.  If you are age 100 or older and want a member of your family to have access to your record, you must provide written consent by completing a proxy  form available at our office. Please speak to our clinical staff about guidelines regarding accounts for patients younger than age 82.  As you activate your MyChart account and need any technical assistance, please call the MyChart technical support line at [phone]) or email your question to [email or web address]. If you email your question(s), please include your name, a return phone number and the best time to reach you.  If you have non-urgent health-related questions, you can send a message to our office through MyChart at [email or web address]. If you have a medical emergency, call 911.  Thank you for using MyChart as your new health and wellness resource!   MyChart licensed from [employer],  4540-9811. Patents Pending.

## 2013-08-10 NOTE — Progress Notes (Signed)
History of Present Illness:  This is a pleasant 52 year old Caucasian female referred for evaluation of acid reflux symptoms unresponsive to daily PPI therapy which she is been on for several years.  She complains of belching, burping, and foull gas erucation, but no burning substernal chest pain or dysphagia.  She has a very interesting past history and underwent gastric bypass surgery in 2000 by Dr. Enzo Bi at [facility] also had previous cholecystectomy, appendectomy, hysterectomy, and total left knee replacement.  She previously been on the care of Dr. Carman Ching in 2010- 2011 do not have these records for review..  She had a negative colonoscopy in 2004 because of a guaiac positive stool.  She had CT scans of the abdomen in August of 2009 in May of 2013.  These exams showed a mild common bile duct dilation a 14 mm with some dilated small bowel loops, but no definite abnormalities otherwise.  She has had chronic anemia with a hemoglobin of 9.8.  The patient relates she's had chronic vitamin D deficiency which could not be corrected by oral calcium and vitamin D replacement.  She complains of polyarticular arthralgias and myalgias and apparently has been diagnosed as possible fibromyalgia.  Also has seasonal allergies and asthma and uses when necessary inhalers.  Some of her symptoms do seem possibly consistent with Raynaud's phenomenon per very cold and painful hands..  As before, her main complaint is belching, burping, without other true GI complaints.  She denies diarrhea or fatty stools, anorexia, weight loss, or any specific food intolerances, abuse of alcohol, cigarettes, or NSAIDs.  No history of night vision problems, recurrent skin rashes  Family history is noncontributory save for esophageal cancer in her mother.  I have reviewed this patient's present history, medical and surgical past history, allergies and medications.     ROS:   All systems were reviewed and are negative  unless otherwise stated in the HPI.  Allergies  Allergen Reactions  . Sodium Pentobarbital [Pentobarbital] Other (See Comments)    Severe nausea.   Outpatient Prescriptions Prior to Visit  Medication Sig Dispense Refill  . albuterol (PROVENTIL HFA;VENTOLIN HFA) 108 (90 BASE) MCG/ACT inhaler Inhale 2 puffs into the lungs every 6 (six) hours as needed for wheezing.      Marland Kitchen dexlansoprazole (DEXILANT) 60 MG capsule Take 60 mg by mouth daily.      . fluticasone (FLONASE) 50 MCG/ACT nasal spray Place 2 sprays into the nose daily.  16 g  6  . ibuprofen (ADVIL,MOTRIN) 800 MG tablet Take 800 mg by mouth every 8 (eight) hours as needed. Patient uses this medication for pain.      . Multiple Vitamin (MULTIVITAMIN) tablet Take 1 tablet by mouth daily.      . predniSONE (DELTASONE) 20 MG tablet Take 2 tablets (40 mg total) by mouth daily.  6 tablet  0   No facility-administered medications prior to visit.   Past Medical History  Diagnosis Date  . Fibromyalgia   . Kidney stone   . Childhood asthma   . Chicken pox   . GERD (gastroesophageal reflux disease)   . Anemia    Past Surgical History  Procedure Laterality Date  . Left knee replacement  01.2011  . Gastric bypass  2002  . Abdominal hysterectomy  2000  . Appendectomy    . Cholecystectomy    . Dental surgery      All Upper Removed   History   Social History  . Marital  Status: Married    Spouse Name: N/A    Number of Children: N/A  . Years of Education: N/A   Social History Main Topics  . Smoking status: Former Games developer  . Smokeless tobacco: Never Used  . Alcohol Use: No  . Drug Use: No  . Sexual Activity: Yes    Birth Control/ Protection: Surgical   Other Topics Concern  . None   Social History Narrative  . None   Family History  Problem Relation Age of Onset  . Asthma Mother     Deceased  . Cancer - Other Mother     Esophageal  . Other Mother 41    Ruptured Bowel  . GER disease Mother   . Diabetes Father      Deceased  . Heart attack Father   . Throat cancer Maternal Grandmother     Deceased  . Ovarian cancer Maternal Aunt     Deceased  . Healthy Brother   . Arthritis/Rheumatoid Daughter   . Psoriasis Daughter        Physical Exam: At pressure 120/70, pulse 72 and weight 244 with a BMI of 41.27 General well developed well nourished patient in no acute distress, appearing their stated age Eyes PERRLA, no icterus, fundoscopic exam per opthamologist Skin no lesions noted Neck supple, no adenopathy, no thyroid enlargement, no tenderness Chest clear to percussion and auscultation Heart no significant murmurs, gallops or rubs noted Abdomen no hepatosplenomegaly masses or tenderness, BS normal.  Well-healed midline abdominal scar.  No succussion splash noted. Extremities no acute joint lesions, edema, phlebitis or evidence of cellulitis. Neurologic patient oriented x 3, cranial nerves intact, no focal neurologic deficits noted. Psychological mental status normal and normal affect.  Assessment and plan: Status post gastric bypass, exact type of bypass surgery unclear.  We have requested records from [facility] for review.  Her symptoms currently are consistent with aerophagia and erucatation, and do not sound like intermittent small bowel obstruction.  This will be probably will be a very difficult symptom to treat.  I think we need to do a thorough lab review of her vitamin levels, micronutrients, and evaluate a possible neuropathy with blood tests including serum copper, vitamin E, thiamine, B6, and vitamin A.  Also ordered anemia profile and standard CBC and metabolic profile.  Zinc level also ordered.  Scheduled her for endoscopy with small bowel biopsy, serum celiac panel, and since it is been more than 10 years with her last colonoscopy, we will repeat her colonoscopy at the time of propofol sedation.  If workup is otherwise negative we will consider esophageal manometry to exclude an  esophageal motility disorder.  She may be a good candidate for low-dose prokinetic therapy.

## 2013-08-11 LAB — VITAMIN D 25 HYDROXY (VIT D DEFICIENCY, FRACTURES): Vit D, 25-Hydroxy: 27 ng/mL — ABNORMAL LOW (ref 30–89)

## 2013-08-13 ENCOUNTER — Encounter: Payer: Self-pay | Admitting: Gastroenterology

## 2013-08-13 ENCOUNTER — Ambulatory Visit (AMBULATORY_SURGERY_CENTER): Payer: Managed Care, Other (non HMO) | Admitting: Gastroenterology

## 2013-08-13 VITALS — BP 120/70 | HR 65 | Temp 98.6°F | Resp 22

## 2013-08-13 DIAGNOSIS — Z9884 Bariatric surgery status: Secondary | ICD-10-CM

## 2013-08-13 DIAGNOSIS — D126 Benign neoplasm of colon, unspecified: Secondary | ICD-10-CM

## 2013-08-13 DIAGNOSIS — Z1211 Encounter for screening for malignant neoplasm of colon: Secondary | ICD-10-CM

## 2013-08-13 DIAGNOSIS — D1330 Benign neoplasm of unspecified part of small intestine: Secondary | ICD-10-CM

## 2013-08-13 DIAGNOSIS — R141 Gas pain: Secondary | ICD-10-CM

## 2013-08-13 DIAGNOSIS — K219 Gastro-esophageal reflux disease without esophagitis: Secondary | ICD-10-CM

## 2013-08-13 LAB — CELIAC PANEL 10
Gliadin IgG: 8.6 U/mL (ref <20)
IgA: 193 mg/dL (ref 69–380)

## 2013-08-13 MED ORDER — SODIUM CHLORIDE 0.9 % IV SOLN: 500.0000 mL | INTRAVENOUS | Status: DC

## 2013-08-13 NOTE — Progress Notes (Signed)
Called to room to assist during endoscopic procedure.  Patient ID and intended procedure confirmed with present staff. Received instructions for my participation in the procedure from the performing physician.  

## 2013-08-13 NOTE — Op Note (Signed)
[facility] [address] [place], [address]   COLONOSCOPY PROCEDURE REPORT  PATIENT: Karla [name]  MR#: [id number] BIRTHDATE: [birth year] , 52  yrs. old GENDER: Female ENDOSCOPIST: Mardella Layman, MD, [facility] REFERRED BY: PROCEDURE DATE:  08/13/2013 PROCEDURE:   Colonoscopy with snare polypectomy Prior Negative Screening - Now for repeat screening.  N/A History of Adenoma - Now for follow-up colonoscopy & has been > or = to 3 yrs.  N/A  Polyps Removed Today? Yes. ASA CLASS:   Class III INDICATIONS:average risk screening. MEDICATIONS: Propofol (Diprivan) and propofol (Diprivan) 300mg  IV  DESCRIPTION OF PROCEDURE:   After the risks benefits and alternatives of the procedure were thoroughly explained, informed consent was obtained.  A digital rectal exam revealed no abnormalities of the rectum.   The LB [id number]  endoscope was introduced through the anus and advanced to the cecum, which was identified by both the appendix and ileocecal valve. No adverse events experienced.   The quality of the prep was excellent, using MoviPrep  The instrument was then slowly withdrawn as the colon was fully examined.      COLON FINDINGS: A fungating sessile polyp ranging between 5-53mm in size was found at the hepatic flexure.  A polypectomy was performed using snare cautery.  The resection was complete and the polyp tissue was completely retrieved.  Retroflexed views revealed internal hemorrhoids. The time to cecum=3 minutes 06 seconds. Withdrawal time=10 minutes 07 seconds.  The scope was withdrawn and the procedure completed. COMPLICATIONS: There were no complications.  ENDOSCOPIC IMPRESSION: Sessile polyp ranging between 5-71mm in size was found at the hepatic flexure; polypectomy was performed using snare cautery ,otherwise normal exam.  RECOMMENDATIONS: 1.  Await pathology results 2.  Repeat colonoscopy in 5 years if polyp adenomatous; otherwise  10 years 3.  Upper endoscopy   eSigned:  Mardella Layman, MD, [facility] 08/13/2013 2:56 PM   cc:

## 2013-08-13 NOTE — Op Note (Signed)
[facility] [address] [place], [address]   ENDOSCOPY PROCEDURE REPORT  PATIENT: Karla [name]  MR#: [id number] BIRTHDATE: 04-26-1961 , 52  yrs. old GENDER: Female ENDOSCOPIST:Saadiq Poche Hale Bogus, MD, [facility] REFERRED BY: PROCEDURE DATE:  08/13/2013 PROCEDURE:   EGD w/ biopsy ASA CLASS:    Class II INDICATIONS: gastric bypass. and a chronic eructation MEDICATION: There was residual sedation effect present from prior procedure and propofol (Diprivan) 100mg  IV TOPICAL ANESTHETIC:  DESCRIPTION OF PROCEDURE:   After the risks and benefits of the procedure were explained, informed consent was obtained.  The LB [id number]  endoscope was introduced through the mouth  and advanced to the descending duodenum .  The instrument was slowly withdrawn as the mucosa was fully examined.    the endoscope passed through the esophagus into the stomach. There is a partial gastrectomy with what appeared to be an intact gastroenterostomy.  There were no marginal ulcerations or stenosis. Multiple pictures were taken for documentation.  Retroflex view the fundus and cardia the stomach otherwise was unremarkable. There is a large gastric remnant present, but could not really classify this as a gastric pouch.  Biopsies were taken of duodenal mucosa for exam.  There appeared to be a small hiatal hernia, and biopsy were obtained the gastroesophageal junction for pathologic exam.  Exam of the esophagus was otherwise normal.   Retroflexed views revealed no abnormalities.    The scope was then withdrawn from the patient and the procedure completed.  COMPLICATIONS: There were no complications.   ENDOSCOPIC IMPRESSION:gastroenterostomy and history of possible gastric bypass surgery.  There certainly no evidence of a small gastric pouch, and there is no retained food in the stomach, marginal ulcerations, or other abnormalities.  Patient may have bacterial overgrowth syndrome  account for a lot of her difficulties.  Labs are currently pending as are biopsies as mentioned above.  RECOMMENDATIONS: 1.  Continue current meds 2.  Await pathology results 3. Consider treatment with Xifaxan for 10-14 days    _______________________________ eSigned:  Mardella Layman, MD, [facility] 08/13/2013 3:06 PM   standard discharge   PATIENT NAME:  Karla [name] MR#: [id number]

## 2013-08-13 NOTE — Progress Notes (Signed)
Patient did not have preoperative order for IV antibiotic SSI prophylaxis. (G8918)  Patient did not experience any of the following events: a burn prior to discharge; a fall within the facility; wrong site/side/patient/procedure/implant event; or a hospital transfer or hospital admission upon discharge from the facility. (G8907)  

## 2013-08-13 NOTE — Patient Instructions (Addendum)
Discharge instructions given with verbal understanding. Handouts on polyps and biopsies taken. Office will call in medication. Resume previous medications. YOU HAD AN ENDOSCOPIC PROCEDURE TODAY AT THE [facility]: Refer to the procedure report that was given to you for any specific questions about what was found during the examination.  If the procedure report does not answer your questions, please call your gastroenterologist to clarify.  If you requested that your care partner not be given the details of your procedure findings, then the procedure report has been included in a sealed envelope for you to review at your convenience later.  YOU SHOULD EXPECT: Some feelings of bloating in the abdomen. Passage of more gas than usual.  Walking can help get rid of the air that was put into your GI tract during the procedure and reduce the bloating. If you had a lower endoscopy (such as a colonoscopy or flexible sigmoidoscopy) you may notice spotting of blood in your stool or on the toilet paper. If you underwent a bowel prep for your procedure, then you may not have a normal bowel movement for a few days.  DIET: Your first meal following the procedure should be a light meal and then it is ok to progress to your normal diet.  A half-sandwich or bowl of soup is an example of a good first meal.  Heavy or fried foods are harder to digest and may make you feel nauseous or bloated.  Likewise meals heavy in dairy and vegetables can cause extra gas to form and this can also increase the bloating.  Drink plenty of fluids but you should avoid alcoholic beverages for 24 hours.  ACTIVITY: Your care partner should take you home directly after the procedure.  You should plan to take it easy, moving slowly for the rest of the day.  You can resume normal activity the day after the procedure however you should NOT DRIVE or use heavy machinery for 24 hours (because of the sedation medicines used during the test).     SYMPTOMS TO REPORT IMMEDIATELY: A gastroenterologist can be reached at any hour.  During normal business hours, 8:30 AM to 5:00 PM Monday through Friday, call [phone].  After hours and on weekends, please call the GI answering service at [phone] who will take a message and have the physician on call contact you.   Following lower endoscopy (colonoscopy or flexible sigmoidoscopy):  Excessive amounts of blood in the stool  Significant tenderness or worsening of abdominal pains  Swelling of the abdomen that is new, acute  Fever of 100F or higher  Following upper endoscopy (EGD)  Vomiting of blood or coffee ground material  New chest pain or pain under the shoulder blades  Painful or persistently difficult swallowing  New shortness of breath  Fever of 100F or higher  Black, tarry-looking stools  FOLLOW UP: If any biopsies were taken you will be contacted by phone or by letter within the next 1-3 weeks.  Call your gastroenterologist if you have not heard about the biopsies in 3 weeks.  Our staff will call the home number listed on your records the next business day following your procedure to check on you and address any questions or concerns that you may have at that time regarding the information given to you following your procedure. This is a courtesy call and so if there is no answer at the home number and we have not heard from you through the emergency physician on call,  we will assume that you have returned to your regular daily activities without incident.  SIGNATURES/CONFIDENTIALITY: You and/or your care partner have signed paperwork which will be entered into your electronic medical record.  These signatures attest to the fact that that the information above on your After Visit Summary has been reviewed and is understood.  Full responsibility of the confidentiality of this discharge information lies with you and/or your care-partner.

## 2013-08-14 ENCOUNTER — Telehealth: Payer: Self-pay | Admitting: Gastroenterology

## 2013-08-14 ENCOUNTER — Encounter: Payer: Self-pay | Admitting: Gastroenterology

## 2013-08-14 ENCOUNTER — Telehealth: Payer: Self-pay

## 2013-08-14 LAB — ZINC: Zinc: 80 ug/dL (ref 60–130)

## 2013-08-14 LAB — COPPER, SERUM: Copper: 133 ug/dL (ref 70–175)

## 2013-08-14 LAB — VITAMIN E: Gamma-Tocopherol (Vit E): 0.7 mg/L (ref <=4.3)

## 2013-08-14 LAB — VITAMIN B1: Vitamin B1 (Thiamine): 12 nmol/L (ref 8–30)

## 2013-08-14 MED ORDER — RIFAXIMIN 550 MG PO TABS: 550.0000 mg | ORAL_TABLET | Freq: Two times a day (BID) | ORAL | Status: DC

## 2013-08-14 MED ORDER — SACCHAROMYCES BOULARDII 250 MG PO CAPS: ORAL_CAPSULE | ORAL | Status: DC

## 2013-08-14 NOTE — Telephone Encounter (Signed)
Per chart, Xifaxan was sent to pharmacy today by Graciella Freer RN, Dr. Norval Gable nurse.

## 2013-08-14 NOTE — Telephone Encounter (Signed)
Post procedure call back number out of order

## 2013-08-15 ENCOUNTER — Encounter: Payer: Self-pay | Admitting: Gastroenterology

## 2013-08-16 ENCOUNTER — Telehealth: Payer: Self-pay | Admitting: *Deleted

## 2013-08-16 MED ORDER — TANDEM PLUS 162-115.2-1 MG PO CAPS: 1.0000 | ORAL_CAPSULE | Freq: Every day | ORAL | Status: DC

## 2013-08-16 NOTE — Telephone Encounter (Signed)
Informed pt of lab results and will order Tandem Plus for her. She requested lab info especially the Vit D results be sent to Dr Tiburcio Pea at [facility]. Done.

## 2013-08-16 NOTE — Telephone Encounter (Signed)
Message copied by Florene Glen on Thu Aug 16, 2013 10:10 AM ------      Message from: Jarold Motto, DAVID R      Created: Thu Aug 16, 2013  9:45 AM       Send me his iron supplement and I would recommend tandem daily.  Her very mild increase in sedimentation rate is probably related to her fibromyalgia. ------

## 2013-08-17 ENCOUNTER — Encounter: Payer: Self-pay | Admitting: Gastroenterology

## 2013-08-29 ENCOUNTER — Encounter: Payer: Self-pay | Admitting: *Deleted

## 2013-09-10 ENCOUNTER — Encounter: Payer: Self-pay | Admitting: Neurology

## 2013-09-11 ENCOUNTER — Encounter: Payer: Self-pay | Admitting: Neurology

## 2013-09-11 ENCOUNTER — Ambulatory Visit (INDEPENDENT_AMBULATORY_CARE_PROVIDER_SITE_OTHER): Payer: Managed Care, Other (non HMO) | Admitting: Neurology

## 2013-09-11 ENCOUNTER — Ambulatory Visit (INDEPENDENT_AMBULATORY_CARE_PROVIDER_SITE_OTHER): Payer: Managed Care, Other (non HMO) | Admitting: Gastroenterology

## 2013-09-11 ENCOUNTER — Encounter: Payer: Self-pay | Admitting: Gastroenterology

## 2013-09-11 VITALS — BP 131/74 | HR 73 | Resp 16 | Ht 65.0 in | Wt 244.0 lb

## 2013-09-11 VITALS — BP 100/60 | HR 66 | Ht 65.0 in | Wt 246.4 lb

## 2013-09-11 DIAGNOSIS — R140 Abdominal distension (gaseous): Secondary | ICD-10-CM

## 2013-09-11 DIAGNOSIS — D509 Iron deficiency anemia, unspecified: Secondary | ICD-10-CM

## 2013-09-11 DIAGNOSIS — Z9884 Bariatric surgery status: Secondary | ICD-10-CM

## 2013-09-11 DIAGNOSIS — K219 Gastro-esophageal reflux disease without esophagitis: Secondary | ICD-10-CM

## 2013-09-11 DIAGNOSIS — R5381 Other malaise: Secondary | ICD-10-CM

## 2013-09-11 DIAGNOSIS — R141 Gas pain: Secondary | ICD-10-CM

## 2013-09-11 DIAGNOSIS — D649 Anemia, unspecified: Secondary | ICD-10-CM

## 2013-09-11 NOTE — Progress Notes (Signed)
[facility]  Provider:  Melvyn Novas, M D  Referring Provider: Johny Blamer, MD Primary Care Physician:  Piedad Climes, PA-C  Chief Complaint  Patient presents with  . Muscle and Joint Pain    Pt ishere today as a new Pt. Pt had vision tested as well for visit Rt eye  20/20 Lt was 20/20 as  well. corrected    HPI:  Karla [name] is a caucasian, married , right handed  52 y.o. female , She is seen here as a referral/ revisit  from [place] Tiburcio Pea  and [place] for ' fibromyalgia'.requesting a  neurologic evaluation.   The patient reports that for many years now she has had myalgias , but seemed at times to flare up and become unbearable.  Her pain quality changes as well as the quantity or intensity. She has been seen by a rheumatologist Dr. Garen Grams, several primary care physicians, by a chiropractor, had a left knee replacement I orthopedics, and had her mandibular teeth old pulled and replaced, patient has no history of trauma that would explain the aseptic necrosis or avascularization of for a new joint or the dental structures. She is chronically in pain, worse in  winter, and worst in her hands. She has flu like ache through out the body. She was diagnosed with C Vit D deficiency , Iron deficiency and is status post hysterectomy in 2000- no periods for several month before. TSH was normal.  In 2001 she had Gastric Bypass Surgery in 2001 , now has a" pouch"  - she has developed  basically a new Stomach enlargement. She lost 100 pounds in one year  and gained it back over 3 years. CRP is normal SED rate 33 -45 - not concerning , obese patient. CT abdomen normal . Vit E,D,K,A all low normal.   A colonoscopy looked normal - Dr Eloise Harman.  The patient reports that she gained weight even while on a diet and exercising. Stool is soft , but formed.     Over the last 3 or 4 years the patient had every winter and the occurrence of severe pain, immobilizing  her. She reports that in 2009 for example her husband had to [place] into bad to get he she needed help with activities of daily living, not just walking but dressing. Any movement was painful at the time. The pain was well the wall body generalized neither peripheral not proximal in dominance. She had no trouble swallowing associated with it, and no voice changes.  She has episodic numbness , she dropped objects out of her hand and has weakness of grip strength. Gait impaired since Knee surgery.  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    Review of Systems: Out of a complete 14 system review, the patient complains of only the following symptoms, and all other reviewed systems are negative. Avascular necrosis, unknown origin.   History   Social History  . Marital Status: Married    Spouse Name: N/A    Number of Children: N/A  . Years of Education: N/A   Occupational History  . Not on file.   Social History Main Topics  . Smoking status: Former Games developer  . Smokeless tobacco: Never Used     Comment: quit 09/2010  . Alcohol Use: No  . Drug Use: No  . Sexual Activity: Yes    Birth Control/ Protection: Surgical   Other Topics Concern  . 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 Asthma Mother     Deceased  . Cancer - Other Mother     Esophageal  . Other Mother 43    Ruptured Bowel  . GER disease Mother   . Coronary artery disease Mother   . Rashes / Skin problems Mother   . Diabetes Father     Deceased  . Heart attack Father   . Coronary artery disease Father   . Throat cancer Maternal Grandmother     Deceased  . Cancer - Other Maternal Grandmother     throat  . Ovarian cancer Maternal Aunt     Deceased  . Healthy Brother   . Arthritis/Rheumatoid Daughter    . Rashes / Skin problems Daughter     psoriasis  . Psoriasis Daughter   . Cancer - Other Daughter     uterine, lung    Past Medical History  Diagnosis Date  . Fibromyalgia   . Kidney stone   . Childhood asthma   . Chicken pox   . GERD (gastroesophageal reflux disease)   . Anemia   . Allergy     SEASONAL  . Blood transfusion without reported diagnosis   . Ulcer     2004  . Tubular adenoma of colon   . Avascular necrosis     knee,left  . Nephrolithiasis   . Asthma   . Vitamin D deficiency   . Osteopenia   . Eczema     hands    Past Surgical History  Procedure Laterality Date  . Left knee replacement  01.2011  . Gastric bypass  2003  . Abdominal hysterectomy  2000    w/opph  . Appendectomy    . Cholecystectomy  2003  . Dental surgery      All Upper Removed  . Breast biopsy Right 1997    Current Outpatient Prescriptions  Medication Sig Dispense Refill  . FeFum-FePo-FA-B Cmp-C-Zn-Mn-Cu (TANDEM PLUS) 162-115.2-1 MG CAPS Take 1 capsule by mouth daily.  30 each  6  . Multiple Vitamin (MULTIVITAMIN) tablet Take 1 tablet by mouth daily.      Marland Kitchen albuterol (PROVENTIL HFA;VENTOLIN HFA) 108 (90 BASE) MCG/ACT inhaler Inhale 2 puffs into the lungs every 6 (six) hours as needed for wheezing.       No current facility-administered medications for this visit.    Allergies as of 09/11/2013 - Review Complete 09/11/2013  Allergen Reaction Noted  . Sodium pentobarbital [pentobarbital] Other (See Comments) 04/05/2012    Vitals: BP 131/74  Pulse 73  Resp 16  Ht 5\' 5"  (1.651 m)  Wt 244 lb (110.678 kg)  BMI 40.6 kg/m2 Last Weight:  Wt Readings from Last 1 Encounters:  09/11/13 244 lb (110.678 kg)   Last Height:   Ht Readings from Last 1 Encounters:  09/11/13 5\' 5"  (1.651 m)   BMI morbidly obese.    Physical exam:  General: The patient is awake, alert and appears in no acute distress. The patient is well groomed. HEENT: Normocephalic, atraumatic.  Face is symmetric  with no facial masking.  Pupils are equal, round & reactive to light & accomodation.  Extraocular tracking shows  with no nystagmus noted.  Hearing grossly intact.  Speech is clear ,  mildly hypophonic with no dysarthria noted.  There is no lip, neck or jaw tremor.  Neck is mildly rigid.  Oropharynx exam reveals .  Mallampati is class .  Tongue protrudes centrally & palate elevates symmetrically.  Tonsils are present. Neck:  Supple. Cardiovascular:  Regular rate and rhythm , without murmurs, rubs or carotid bruit. Respiratory: Lungs are clear to auscultation without wheezing, rhonchi or crackles noted. Abdomen:  Soft, non-tender & non-distended with normal bowel sounds appreciated on auscultation. Extremities:  There is no pitting edema in the distal lower extremities bilaterally.  Pedal pulses are intact. Skin:  Warm & dry without trophic changes noted.   Musculoskeletal:  Exam reveals no joint deformities, tenderness, swelling or erythema.   Neurologic exam : Mental Status:  The patient is awake, alert, oriented to all 4 spheres.  Her  memory, language & knowledge are appropriate.  -no aphasia, agnosia, apraxia or anomia.   Speech is clear with normal prosody & enunciation.  Thought process is linear.  Mood is congruent.  Cranial nerves: Described above under HEENT exam.   In addition, shoulder shrug is normal with equal shoulder height noted.  Motor exam:   Normal tone and normal muscle bulk , strength in all extremities. Proximal and distal preserved muscle strength and tone.   No resting tremor, no postural or action tremor.  Romberg is  negative.  Reflexes are  1+ throughout.  Fine Motor Testing:   No impairment of finger taps.  Normal hand movements.  No impairment of rapid alternating patting.   No impairment of foot taps.   No impairment of foot agility.  Cerebellar testing:  No dysmetria or intention tremor on finger-to-nose testing.  There is no truncal or gait ataxia.  Sensory:   Fine touch, pinprick, vibration & temperature sensation were  normal in all extremities.  No difficulty standing up form seated position .  Posture  Normal .  Ambulates  With normal  arm swing.  Turns  With 3-4  steps.  Balance is  Tandem gait is impaired,  But there is evidence of astasia/  abasia.   Assessment:  After physical and neurologic examination, review of laboratory studies, imaging, testing and pre-existing records, assessment will be reviewed on the problem list. 1) there is unclear myalgia without myositis and no neuronal component noted. Neither neuropathy nor ataxia were seen. CK MB, and CRP are not specific and not elevated , Sed rate is a coarse indicator and allowed to reach  28 in a 52 year old woman.  2) There is no progressive nor chronic neurologic disease identified. Neither demyelination , axonal nor dermatomal injury , no CNS or cranial nerve abnormalities.    3) the patients history of gastric bypass may point to malabsorption- i reviewed her labs, these are in expected range.   Plan: NoTreatment plan or additional workup are recommended through neurology.

## 2013-09-11 NOTE — Patient Instructions (Signed)
Your physician has requested that you go to the basement for the following lab work on 11-12-2013:(Lab is open from 730 am to 530 pm) Iron Levels  We will call you with your results

## 2013-09-11 NOTE — Patient Instructions (Signed)
Myalgia, Adult Myalgia is the medical term for muscle pain. It is a symptom of many things. Nearly everyone at some time in their life has this. The most common cause for muscle pain is overuse or straining and more so when you are not in shape. Injuries and muscle bruises cause myalgias. Muscle pain without a history of injury can also be caused by a virus. It frequently comes along with the flu. Myalgia not caused by muscle strain can be present in a large number of infectious diseases. Some autoimmune diseases like lupus and fibromyalgia can cause muscle pain. Myalgia may be mild, or severe. SYMPTOMS  The symptoms of myalgia are simply muscle pain. Most of the time this is short lived and the pain goes away without treatment. DIAGNOSIS  Myalgia is diagnosed by your caregiver by taking your history. This means you tell him when the problems began, what they are, and what has been happening. If this has not been a long term problem, your caregiver may want to watch for a while to see what will happen. If it has been long term, they may want to do additional testing. TREATMENT  The treatment depends on what the underlying cause of the muscle pain is. Often anti-inflammatory medications will help. HOME CARE INSTRUCTIONS  If the pain in your muscles came from overuse, slow down your activities until the problems go away.  Myalgia from overuse of a muscle can be treated with alternating hot and cold packs on the muscle affected or with cold for the first couple days. If either heat or cold seems to make things worse, stop their use.  Apply ice to the sore area for 15-20 minutes, 3-4 times per day, while awake for the first 2 days of muscle soreness, or as directed. Put the ice in a plastic bag and place a towel between the bag of ice and your skin.  Only take over-the-counter or prescription medicines for pain, discomfort, or fever as directed by your caregiver.  Regular gentle exercise may help if  you are not active.  Stretching before strenuous exercise can help lower the risk of myalgia. It is normal when beginning an exercise regimen to feel some muscle pain after exercising. Muscles that have not been used frequently will be sore at first. If the pain is extreme, this may mean injury to a muscle. SEEK MEDICAL CARE IF:  You have an increase in muscle pain that is not relieved with medication.  You begin to run a temperature.  You develop nausea and vomiting.  You develop a stiff and painful neck.  You develop a rash.  You develop muscle pain after a tick bite.  You have continued muscle pain while working out even after you are in good condition. SEEK IMMEDIATE MEDICAL CARE IF: Any of your problems are getting worse and medications are not helping- call your pain management physician. Marland Kitchen MAKE SURE YOU:   Understand these instructions.  Will watch your condition.  Will get help right away if you are not doing well or get worse. Document Released: 10/14/2006 Document Revised: 02/14/2012 Document Reviewed: 01/03/2007 [facility] Patient Information 2014 [place], [place].

## 2013-09-11 NOTE — Progress Notes (Signed)
History of Present Illness: This is a 52 year old Caucasian female status post gastric bypass procedure 14 years ago.  She continues to complain of belching, burping, and eructation despite daily PPI therapy.  Recent endoscopy and colonoscopy were unremarkable with a large gastric remnant.  She did have an adenomatous polyp excised.  Workup for celiac disease and H. pylori infection was negative.  Metabolic workup was unremarkable except for a mild vitamin D deficiency and some iron deficiency associated with her bypass procedures.  She is on supplemental iron and multivitamin replacement therapy at this time.  She denies diarrhea, dysphagia, or any hepatobiliary complaints.  She is status post cholecystectomy with CT scan of the abdomen last performed in May of 2013.    Current Medications, Allergies, Past Medical History, Past Surgical History, Family History and Social History were reviewed in [employer] record.  ROS: All systems were reviewed and are negative unless otherwise stated in the HPI.         Assessment and plan: Nonspecific gas and bloating and eructation the patient is status post gastric bypass.  Empiric treatment for bacterial overgrowth syndrome with Xifaxan had no effect on her symptomatology.  Vascular to all of her current medications and repeat her serum iron levels in 2 months time.  I suspect she will need chronic vitamin D and iron replacement therapy.  Also followup colonoscopy per standard protocol per adenomatous polyp.  Gastric biopsies were unremarkable an esophageal biopsy showed no evidence of Barrett's mucosa.  We'll continue PPI therapy for probable symptomatic GERD.  Advised to continue to watch her weight and to avoid sorbitol, fructose, and other nonabsorbable carbohydrates.  She is currently on probiotic therapy.

## 2013-10-11 ENCOUNTER — Other Ambulatory Visit: Payer: Self-pay

## 2014-02-14 ENCOUNTER — Encounter (HOSPITAL_BASED_OUTPATIENT_CLINIC_OR_DEPARTMENT_OTHER): Payer: Self-pay | Admitting: Emergency Medicine

## 2014-02-14 ENCOUNTER — Emergency Department (HOSPITAL_BASED_OUTPATIENT_CLINIC_OR_DEPARTMENT_OTHER)
Admission: EM | Admit: 2014-02-14 | Discharge: 2014-02-14 | Disposition: A | Discharge status: Home / Self Care | Payer: Managed Care, Other (non HMO) | Attending: Emergency Medicine | Admitting: Emergency Medicine

## 2014-02-14 DIAGNOSIS — S39012A Strain of muscle, fascia and tendon of lower back, initial encounter: Secondary | ICD-10-CM

## 2014-02-14 DIAGNOSIS — X58XXXA Exposure to other specified factors, initial encounter: Secondary | ICD-10-CM | POA: Insufficient documentation

## 2014-02-14 DIAGNOSIS — Z87442 Personal history of urinary calculi: Secondary | ICD-10-CM | POA: Insufficient documentation

## 2014-02-14 DIAGNOSIS — S335XXA Sprain of ligaments of lumbar spine, initial encounter: Secondary | ICD-10-CM | POA: Insufficient documentation

## 2014-02-14 DIAGNOSIS — M949 Disorder of cartilage, unspecified: Secondary | ICD-10-CM

## 2014-02-14 DIAGNOSIS — Z8619 Personal history of other infectious and parasitic diseases: Secondary | ICD-10-CM | POA: Insufficient documentation

## 2014-02-14 DIAGNOSIS — E559 Vitamin D deficiency, unspecified: Secondary | ICD-10-CM | POA: Insufficient documentation

## 2014-02-14 DIAGNOSIS — Y929 Unspecified place or not applicable: Secondary | ICD-10-CM | POA: Insufficient documentation

## 2014-02-14 DIAGNOSIS — Y939 Activity, unspecified: Secondary | ICD-10-CM | POA: Insufficient documentation

## 2014-02-14 DIAGNOSIS — Z79899 Other long term (current) drug therapy: Secondary | ICD-10-CM | POA: Insufficient documentation

## 2014-02-14 DIAGNOSIS — Z860100 Personal history of colon polyps, unspecified: Secondary | ICD-10-CM | POA: Insufficient documentation

## 2014-02-14 DIAGNOSIS — Z8719 Personal history of other diseases of the digestive system: Secondary | ICD-10-CM | POA: Insufficient documentation

## 2014-02-14 DIAGNOSIS — D649 Anemia, unspecified: Secondary | ICD-10-CM | POA: Insufficient documentation

## 2014-02-14 DIAGNOSIS — Z872 Personal history of diseases of the skin and subcutaneous tissue: Secondary | ICD-10-CM | POA: Insufficient documentation

## 2014-02-14 DIAGNOSIS — Z87891 Personal history of nicotine dependence: Secondary | ICD-10-CM | POA: Insufficient documentation

## 2014-02-14 DIAGNOSIS — Z791 Long term (current) use of non-steroidal anti-inflammatories (NSAID): Secondary | ICD-10-CM | POA: Insufficient documentation

## 2014-02-14 DIAGNOSIS — M899 Disorder of bone, unspecified: Secondary | ICD-10-CM | POA: Insufficient documentation

## 2014-02-14 DIAGNOSIS — J45909 Unspecified asthma, uncomplicated: Secondary | ICD-10-CM | POA: Insufficient documentation

## 2014-02-14 MED ORDER — HYDROCODONE-ACETAMINOPHEN 5-325 MG PO TABS: 1.0000 | ORAL_TABLET | Freq: Four times a day (QID) | ORAL | Status: DC | PRN

## 2014-02-14 MED ORDER — CYCLOBENZAPRINE HCL 10 MG PO TABS: 10.0000 mg | ORAL_TABLET | Freq: Two times a day (BID) | ORAL | Status: DC | PRN

## 2014-02-14 MED ORDER — NAPROXEN 500 MG PO TABS
500.0000 mg | ORAL_TABLET | Freq: Two times a day (BID) | ORAL | Status: DC
Start: 2014-02-14 — End: 2014-11-12

## 2014-02-14 MED ORDER — HYDROCODONE-ACETAMINOPHEN 5-325 MG PO TABS
2.0000 | ORAL_TABLET | Freq: Once | ORAL | Status: AC
  Administered 2014-02-14: 2 via ORAL
  Filled 2014-02-14: qty 2

## 2014-02-14 MED ORDER — CYCLOBENZAPRINE HCL 10 MG PO TABS
5.0000 mg | ORAL_TABLET | Freq: Once | ORAL | Status: AC
  Administered 2014-02-14: 5 mg via ORAL
  Filled 2014-02-14: qty 1

## 2014-02-14 NOTE — Discharge Instructions (Signed)
Back Pain, Adult Low back pain is very common. About 1 in 5 people have back pain.The cause of low back pain is rarely dangerous. The pain often gets better over time.About half of people with a sudden onset of back pain feel better in just 2 weeks. About 8 in 10 people feel better by 6 weeks.  CAUSES Some common causes of back pain include:  Strain of the muscles or ligaments supporting the spine.  Wear and tear (degeneration) of the spinal discs.  Arthritis.  Direct injury to the back. DIAGNOSIS Most of the time, the direct cause of low back pain is not known.However, back pain can be treated effectively even when the exact cause of the pain is unknown.Answering your caregiver's questions about your overall health and symptoms is one of the most accurate ways to make sure the cause of your pain is not dangerous. If your caregiver needs more information, he or she may order lab work or imaging tests (X-rays or MRIs).However, even if imaging tests show changes in your back, this usually does not require surgery. HOME CARE INSTRUCTIONS For many people, back pain returns.Since low back pain is rarely dangerous, it is often a condition that people can learn to [facility] their own.   Remain active. It is stressful on the back to sit or stand in one place. Do not sit, drive, or stand in one place for more than 30 minutes at a time. Take short walks on level surfaces as soon as pain allows.Try to increase the length of time you walk each day.  Do not stay in bed.Resting more than 1 or 2 days can delay your recovery.  Do not avoid exercise or work.Your body is made to move.It is not dangerous to be active, even though your back may hurt.Your back will likely heal faster if you return to being active before your pain is gone.  Pay attention to your body when you bend and lift. Many people have less discomfortwhen lifting if they bend their knees, keep the load close to their bodies,and  avoid twisting. Often, the most comfortable positions are those that put less stress on your recovering back.  Find a comfortable position to sleep. Use a firm mattress and lie on your side with your knees slightly bent. If you lie on your back, put a pillow under your knees.  Only take over-the-counter or prescription medicines as directed by your caregiver. Over-the-counter medicines to reduce pain and inflammation are often the most helpful.Your caregiver may prescribe muscle relaxant drugs.These medicines help dull your pain so you can more quickly return to your normal activities and healthy exercise.  Put ice on the injured area.  Put ice in a plastic bag.  Place a towel between your skin and the bag.  Leave the ice on for 15-20 minutes, 03-04 times a day for the first 2 to 3 days. After that, ice and heat may be alternated to reduce pain and spasms.  Ask your caregiver about trying back exercises and gentle massage. This may be of some benefit.  Avoid feeling anxious or stressed.Stress increases muscle tension and can worsen back pain.It is important to recognize when you are anxious or stressed and learn ways to manage it.Exercise is a great option. SEEK MEDICAL CARE IF:  You have pain that is not relieved with rest or medicine.  You have pain that does not improve in 1 week.  You have new symptoms.  You are generally not feeling well. SEEK  IMMEDIATE MEDICAL CARE IF:   You have pain that radiates from your back into your legs.  You develop new bowel or bladder control problems.  You have unusual weakness or numbness in your arms or legs.  You develop nausea or vomiting.  You develop abdominal pain.  You feel faint. Document Released: 11/22/2005 Document Revised: 05/23/2012 Document Reviewed: 04/12/2011 [facility] Patient Information 2014 [place], [place].  Back Exercises Back exercises help treat and prevent back injuries. The goal of back exercises is to increase  the strength of your abdominal and back muscles and the flexibility of your back. These exercises should be started when you no longer have back pain. Back exercises include:  Pelvic Tilt. Lie on your back with your knees bent. Tilt your pelvis until the lower part of your back is against the floor. Hold this position 5 to 10 sec and repeat 5 to 10 times.  Knee to Chest. Pull first 1 knee up against your chest and hold for 20 to 30 seconds, repeat this with the other knee, and then both knees. This may be done with the other leg straight or bent, whichever feels better.  Sit-Ups or Curl-Ups. Bend your knees 90 degrees. Start with tilting your pelvis, and do a partial, slow sit-up, lifting your trunk only 30 to 45 degrees off the floor. Take at least 2 to 3 seconds for each sit-up. Do not do sit-ups with your knees out straight. If partial sit-ups are difficult, simply do the above but with only tightening your abdominal muscles and holding it as directed.  Hip-Lift. Lie on your back with your knees flexed 90 degrees. Push down with your feet and shoulders as you raise your hips a couple inches off the floor; hold for 10 seconds, repeat 5 to 10 times.  Back arches. Lie on your stomach, propping yourself up on bent elbows. Slowly press on your hands, causing an arch in your low back. Repeat 3 to 5 times. Any initial stiffness and discomfort should lessen with repetition over time.  Shoulder-Lifts. Lie face down with arms beside your body. Keep hips and torso pressed to floor as you slowly lift your head and shoulders off the floor. Do not overdo your exercises, especially in the beginning. Exercises may cause you some mild back discomfort which lasts for a few minutes; however, if the pain is more severe, or lasts for more than 15 minutes, do not continue exercises until you see your caregiver. Improvement with exercise therapy for back problems is slow.  See your caregivers for assistance with  developing a proper back exercise program. Document Released: 12/30/2004 Document Revised: 02/14/2012 Document Reviewed: 09/23/2011 [facility] Patient Information 2014 [place].

## 2014-02-14 NOTE — ED Provider Notes (Signed)
CSN: [id number]     Arrival date & time 02/14/14  6270 History   First MD Initiated Contact with Patient 02/14/14 1024     Chief Complaint  Patient presents with  . Back Pain     (Consider location/radiation/quality/duration/timing/severity/associated sxs/prior Treatment) Patient is a 53 y.o. female presenting with back pain. The history is provided by the patient. No language interpreter was used.  Back Pain Location:  Lumbar spine Quality:  Aching Radiates to:  Does not radiate Pain severity:  Severe Onset quality:  Sudden Duration:  1 day Timing:  Constant Progression:  Unchanged Chronicity:  New Context comment:  Standing up Relieved by:  Lying down and being still Worsened by:  Movement and standing Associated symptoms: no abdominal pain, no abdominal swelling, no bladder incontinence, no bowel incontinence, no chest pain, no dysuria, no fever, no headaches, no numbness, no paresthesias, no pelvic pain and no weakness   Risk factors: obesity     Past Medical History  Diagnosis Date  . Fibromyalgia   . Kidney stone   . Childhood asthma   . Chicken pox   . GERD (gastroesophageal reflux disease)   . Anemia   . Allergy     SEASONAL  . Blood transfusion without reported diagnosis   . Ulcer     2004  . Tubular adenoma of colon   . Avascular necrosis     knee,left  . Nephrolithiasis   . Asthma   . Vitamin D deficiency   . Osteopenia   . Eczema     hands   Past Surgical History  Procedure Laterality Date  . Left knee replacement  01.2011  . Gastric bypass  2003  . Abdominal hysterectomy  2000    w/opph  . Appendectomy    . Cholecystectomy  2003  . Dental surgery      All Upper Removed  . Breast biopsy Right 1997   Family History  Problem Relation Age of Onset  . Asthma Mother     Deceased  . Cancer - Other Mother     Esophageal  . Other Mother 99    Ruptured Bowel  . GER disease Mother   . Coronary artery disease Mother   . Rashes / Skin problems  Mother   . Diabetes Father     Deceased  . Heart attack Father   . Coronary artery disease Father   . Throat cancer Maternal Grandmother     Deceased  . Cancer - Other Maternal Grandmother     throat  . Ovarian cancer Maternal Aunt     Deceased  . Healthy Brother   . Arthritis/Rheumatoid Daughter   . Rashes / Skin problems Daughter     psoriasis  . Psoriasis Daughter   . Cancer - Other Daughter     uterine, lung   History  Substance Use Topics  . Smoking status: Former Smoker    Quit date: 02/15/2012  . Smokeless tobacco: Never Used     Comment: quit 09/2010  . Alcohol Use: No   OB History   Grav Para Term Preterm Abortions TAB SAB Ect Mult Living                 Review of Systems  Constitutional: Negative for fever, chills, diaphoresis, activity change, appetite change and fatigue.  HENT: Negative for congestion, facial swelling, rhinorrhea and sore throat.   Eyes: Negative for photophobia and discharge.  Respiratory: Negative for cough, chest tightness and shortness of breath.  Cardiovascular: Negative for chest pain, palpitations and leg swelling.  Gastrointestinal: Negative for nausea, vomiting, abdominal pain, diarrhea and bowel incontinence.  Endocrine: Negative for polydipsia and polyuria.  Genitourinary: Negative for bladder incontinence, dysuria, frequency, difficulty urinating and pelvic pain.  Musculoskeletal: Positive for back pain. Negative for arthralgias, neck pain and neck stiffness.  Skin: Negative for color change and wound.  Allergic/Immunologic: Negative for immunocompromised state.  Neurological: Negative for facial asymmetry, weakness, numbness, headaches and paresthesias.  Hematological: Does not bruise/bleed easily.  Psychiatric/Behavioral: Negative for confusion and agitation.      Allergies  Sodium pentobarbital  Home Medications   Current Outpatient Rx  Name  Route  Sig  Dispense  Refill  . VITAMIN D, ERGOCALCIFEROL, PO   Oral    Take by mouth.         Marland Kitchen albuterol (PROVENTIL HFA;VENTOLIN HFA) 108 (90 BASE) MCG/ACT inhaler   Inhalation   Inhale 2 puffs into the lungs every 6 (six) hours as needed for wheezing.         . cyclobenzaprine (FLEXERIL) 10 MG tablet   Oral   Take 1 tablet (10 mg total) by mouth 2 (two) times daily as needed for muscle spasms.   20 tablet   0   . FeFum-FePo-FA-B Cmp-C-Zn-Mn-Cu (TANDEM PLUS) 162-115.2-1 MG CAPS   Oral   Take 1 capsule by mouth daily.   30 each   6   . HYDROcodone-acetaminophen (NORCO) 5-325 MG per tablet   Oral   Take 1 tablet by mouth every 6 (six) hours as needed.   10 tablet   0   . Multiple Vitamin (MULTIVITAMIN) tablet   Oral   Take 1 tablet by mouth daily.         . naproxen (NAPROSYN) 500 MG tablet   Oral   Take 1 tablet (500 mg total) by mouth 2 (two) times daily with a meal.   20 tablet   0    BP 142/69  Pulse 68  Temp(Src) 97.7 F (36.5 C) (Oral)  Resp 18  Ht 5\' 5"  (1.651 m)  SpO2 98% Physical Exam  Constitutional: She is oriented to person, place, and time. She appears well-developed and well-nourished. No distress.  HENT:  Head: Normocephalic and atraumatic.  Mouth/Throat: No oropharyngeal exudate.  Eyes: Pupils are equal, round, and reactive to light.  Neck: Normal range of motion. Neck supple.  Cardiovascular: Normal rate, regular rhythm and normal heart sounds.  Exam reveals no gallop and no friction rub.   No murmur heard. Pulmonary/Chest: Effort normal and breath sounds normal. No respiratory distress. She has no wheezes. She has no rales.  Abdominal: Soft. Bowel sounds are normal. She exhibits no distension and no mass. There is no tenderness. There is no rebound and no guarding.  Musculoskeletal: Normal range of motion. She exhibits no edema and no tenderness.  No reproducible tenderness  Neurological: She is alert and oriented to person, place, and time.  Skin: Skin is warm and dry.  Psychiatric: She has a normal mood  and affect.    ED Course  Procedures (including critical care time) Labs Review Labs Reviewed - No data to display Imaging Review No results found.   EKG Interpretation None      MDM   Final diagnoses:  Lumbar strain    Pt is a 53 y.o. female with Pmhx as above who presents with low back pain. Pt states about 30 hrs ago, she out of bed and "threw her back out".  She has had band-like pain across low back w/ tightness/spasm and pain of BL anterior thighs. No numbness, weakness, fever, bowel or bladder incontinence. No reproducible pain. On PE, VSS, pt in NAD. She ambulated to room w nml gait. No focal neuro findings, no ttp of low back, negative straight leg raise BL. Suspect muscle strain.  Treated in dept w/ norco/flexeril with good response. Rec continued supportive care at home w/ scheduled NSAIDs, flxeril for spasm, and norco for breakthrough pain. Return precautions given for new or worsening symptoms including worsening pain, numbness, weakness, fever, bowel or bladder incontinence.         Neta Ehlers, MD 02/15/14 2255

## 2014-02-14 NOTE — ED Notes (Signed)
Low back pain that started yesterday after standing up from a chair.

## 2014-09-02 ENCOUNTER — Encounter: Payer: Self-pay | Admitting: Hematology & Oncology

## 2014-09-05 ENCOUNTER — Telehealth: Payer: Self-pay | Admitting: Hematology

## 2014-09-05 NOTE — Telephone Encounter (Signed)
C/D 09/05/14 for appt. 09/16/14

## 2014-09-05 NOTE — Telephone Encounter (Signed)
S/W PATIENT AND GAVE NP APPT FOR 10/12 @ 10:30 W/DR. SEHBAI

## 2014-09-16 ENCOUNTER — Ambulatory Visit (HOSPITAL_BASED_OUTPATIENT_CLINIC_OR_DEPARTMENT_OTHER): Payer: Managed Care, Other (non HMO) | Admitting: Hematology

## 2014-09-16 ENCOUNTER — Encounter: Payer: Self-pay | Admitting: Hematology

## 2014-09-16 ENCOUNTER — Other Ambulatory Visit (HOSPITAL_BASED_OUTPATIENT_CLINIC_OR_DEPARTMENT_OTHER): Payer: Managed Care, Other (non HMO)

## 2014-09-16 ENCOUNTER — Encounter: Payer: Self-pay | Admitting: *Deleted

## 2014-09-16 ENCOUNTER — Ambulatory Visit: Payer: Managed Care, Other (non HMO)

## 2014-09-16 ENCOUNTER — Telehealth: Payer: Self-pay | Admitting: Hematology

## 2014-09-16 VITALS — BP 121/51 | HR 61 | Temp 98.1°F | Resp 17 | Ht 65.0 in | Wt 253.1 lb

## 2014-09-16 DIAGNOSIS — R700 Elevated erythrocyte sedimentation rate: Secondary | ICD-10-CM

## 2014-09-16 DIAGNOSIS — R5383 Other fatigue: Secondary | ICD-10-CM

## 2014-09-16 DIAGNOSIS — R233 Spontaneous ecchymoses: Secondary | ICD-10-CM | POA: Insufficient documentation

## 2014-09-16 DIAGNOSIS — T148XXA Other injury of unspecified body region, initial encounter: Secondary | ICD-10-CM

## 2014-09-16 DIAGNOSIS — Z862 Personal history of diseases of the blood and blood-forming organs and certain disorders involving the immune mechanism: Secondary | ICD-10-CM

## 2014-09-16 DIAGNOSIS — D539 Nutritional anemia, unspecified: Secondary | ICD-10-CM

## 2014-09-16 DIAGNOSIS — R238 Other skin changes: Secondary | ICD-10-CM

## 2014-09-16 LAB — CBC WITH DIFFERENTIAL/PLATELET
BASO%: 0.3 % (ref 0.0–2.0)
Basophils Absolute: 0 10*3/uL (ref 0.0–0.1)
EOS%: 2.1 % (ref 0.0–7.0)
Eosinophils Absolute: 0.2 10*3/uL (ref 0.0–0.5)
HEMATOCRIT: 38.4 % (ref 34.8–46.6)
HGB: 12.2 g/dL (ref 11.6–15.9)
LYMPH%: 28.8 % (ref 14.0–49.7)
MCH: 26.2 pg (ref 25.1–34.0)
MCHC: 31.8 g/dL (ref 31.5–36.0)
MCV: 82.6 fL (ref 79.5–101.0)
MONO#: 0.6 10*3/uL (ref 0.1–0.9)
MONO%: 6 % (ref 0.0–14.0)
NEUT#: 6.2 10*3/uL (ref 1.5–6.5)
NEUT%: 62.8 % (ref 38.4–76.8)
PLATELETS: 253 10*3/uL (ref 145–400)
RBC: 4.65 10*6/uL (ref 3.70–5.45)
RDW: 14.3 % (ref 11.2–14.5)
WBC: 9.8 10*3/uL (ref 3.9–10.3)
lymph#: 2.8 10*3/uL (ref 0.9–3.3)

## 2014-09-16 LAB — IRON AND TIBC CHCC
%SAT: 10 % — AB (ref 21–57)
Iron: 44 ug/dL (ref 41–142)
TIBC: 416 ug/dL (ref 236–444)
UIBC: 373 ug/dL (ref 120–384)

## 2014-09-16 LAB — FERRITIN CHCC: FERRITIN: 14 ng/mL (ref 9–269)

## 2014-09-16 MED ORDER — CYANOCOBALAMIN 1000 MCG/ML IJ SOLN
1000.0000 ug | INTRAMUSCULAR | Status: DC
  Administered 2014-09-16: 1000 ug via INTRAMUSCULAR

## 2014-09-16 MED ORDER — CYANOCOBALAMIN 1000 MCG/ML IJ SOLN
INTRAMUSCULAR | Status: AC
  Filled 2014-09-16: qty 1

## 2014-09-16 NOTE — Progress Notes (Signed)
Checked in new patient with no issues prior to seeing the dr. She has appt card and [place] been [place] the [place].

## 2014-09-16 NOTE — Telephone Encounter (Signed)
Pt confirmed labs/ov/inj per 10/12 POF, gave pt AVS.... KJ

## 2014-09-16 NOTE — Patient Instructions (Signed)

## 2014-09-16 NOTE — Progress Notes (Signed)
[place] HEMATOLOGY CONSULT NOTE DATE OF VISIT: 09/16/2014  Patient Care Team: Brunetta Jeans, PA-C as PCP - General (Physician Assistant) [facility] [place] [address] [facility]  CHIEF COMPLAINTS/PURPOSE OF CONSULTATION:   Mild Leukocytosis, hx of anemia, Easy Bruising, history of Iron deficiency and hx of Gastric bypass surgery with question of nutritional deficiencies.  HISTORY OF PRESENTING ILLNESS:   Karla [name] 53 y.o. female from [place] is referred here for above problems. She has a history of fibromyalgia, chronic back pain, elevated ESR, teeth and gum problems requiring extraction of teeth, generalized bone pain especially in the spine area. She also bruises easily used to get heavy periods since she had her hysterectomy. She still have her ovaries. A sister was diagnosed with ovarian cancer in her 74s but patient is not aware of the results of BRCA testing which may be important for her to know. She does complain of having fatigue all the time, not feeling well and also applying for disability. She does take a multivitamin daily. She does not sleep well. She had severe arthritis in her left knee requiring a knee replacement in 2011. Her gastric bypass surgery was in 2000. She had a colonoscopy last year. There was a polyp removed in the next one will be scheduled in 5 years. She went to back specialists and they're planning to do MRI next month.  Patient is also been under a lot of stress because of her legal custody battle for her 69 year old granddaughter and patient finally have her possession now. On 12/15/2009 her hemoglobin was 9.8 g per crit 28.9 white count 8.6 and platelets were 175. The vitamin B12 level checked on 08/10/2013 was 1184, ferritin 16.1 TSH 0.96, folic acid more than 24, iron 40, transferrin 365, endomysial antibodies negative, tissue transglutaminase normal, zinc level LXXX normal copper 133 normal  vitamin B1 normal, vitamin B6 was normal and vitamin D was low at 27, vitamin 8 level was normal and vitamin E. was also normal, ESR was 45.  Patient is taking Dexilant 4 GERD, Cymbalta for fibromyalgia, ibuprofen for musculoskeletal pain,  Vitamin D and naproxen. Her mother was diagnosed with his facial cancer and a sister has ovarian cancer. Patient quit smoking in October 2011. She does not consume enough fruits and vegetables.   MEDICAL HISTORY:  Past Medical History  Diagnosis Date  . Fibromyalgia   . Kidney stone   . Childhood asthma   . Chicken pox   . GERD (gastroesophageal reflux disease)   . Anemia   . Allergy     SEASONAL  . Blood transfusion without reported diagnosis   . Ulcer     2004  . Tubular adenoma of colon   . Avascular necrosis     knee,left  . Nephrolithiasis   . Asthma   . Vitamin D deficiency   . Osteopenia   . Eczema     hands  . Easy bruising   . ESR raised   . History of anemia   . H/O gastric bypass     SURGICAL HISTORY: Past Surgical History  Procedure Laterality Date  . Left knee replacement  01.2011  . Gastric bypass  2003  . Abdominal hysterectomy  2000    w/opph  . Appendectomy    . Cholecystectomy  2003  . Dental surgery      All Upper Removed  . Breast biopsy Right 1997    SOCIAL HISTORY:  History   Social History  . Marital Status: Married    Spouse Name: N/A    Number of Children: N/A  . Years of Education: N/A   Occupational History  . Not on file.   Social History Main Topics  . Smoking status: Former Smoker    Quit date: 02/15/2012  . Smokeless tobacco: Never Used     Comment: quit 09/2010  . Alcohol Use: No  . Drug Use: No  . Sexual Activity: Yes    Birth Control/ Protection: Surgical   Other Topics Concern  . 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 Patient is on  disability trying to maintain it. She won a cutody battle for a 2 year old grand daughter.    FAMILY HISTORY: Family History  Problem Relation Age of Onset  . Asthma Mother     Deceased  . Cancer - Other Mother     Esophageal  . Other Mother 48    Ruptured Bowel  . GER disease Mother   . Coronary artery disease Mother   . Rashes / Skin problems Mother   . Diabetes Father     Deceased  . Heart attack Father   . Coronary artery disease Father   . Throat cancer Maternal Grandmother     Deceased  . Cancer - Other Maternal Grandmother     throat  . Ovarian cancer Maternal Aunt     Deceased  . Healthy Brother   . Arthritis/Rheumatoid Daughter   . Rashes / Skin problems Daughter     psoriasis  . Psoriasis Daughter   . Cancer - Other Daughter     uterine, lung  . Ovarian cancer Sister     DIETARY HISTORY:  ALLERGIES:  is allergic to sodium pentobarbital.  MEDICATIONS:  Current Outpatient Prescriptions  Medication Sig Dispense Refill  . albuterol (PROVENTIL HFA;VENTOLIN HFA) 108 (90 BASE) MCG/ACT inhaler Inhale 2 puffs into the lungs every 6 (six) hours as needed for wheezing.      . cyclobenzaprine (FLEXERIL) 10 MG tablet Take 1 tablet (10 mg total) by mouth 2 (two) times daily as needed for muscle spasms.  20 tablet  0  . dexamethasone (DECADRON) 0.1 % ophthalmic suspension Place into both eyes 2 (two) times daily.      . DULoxetine (CYMBALTA) 60 MG capsule Take 60 mg by mouth daily.      Marland Kitchen HYDROcodone-acetaminophen (NORCO) 5-325 MG per tablet Take 1 tablet by mouth every 6 (six) hours as needed.  10 tablet  0  . Multiple Vitamin (MULTIVITAMIN) tablet Take 1 tablet by mouth daily.      . naproxen (NAPROSYN) 500 MG tablet Take 1 tablet (500 mg total) by mouth 2 (two) times daily with a meal.  20 tablet  0  . predniSONE (DELTASONE) 5 MG tablet Take 5 mg by mouth daily as needed.      Marland Kitchen VITAMIN D, ERGOCALCIFEROL, PO Take by mouth.       Current Facility-Administered  Medications  Medication Dose Route Frequency Provider Last Rate Last Dose  . cyanocobalamin ((VITAMIN B-12)) injection 1,000 mcg  1,000 mcg Intramuscular Q30 days Ferrel Simington Marla Roe, MD   1,000 mcg at 09/16/14 1149    REVIEW OF SYSTEMS:   Constitutional: Denies fevers, chills or abnormal night sweats, +fatigue  Eyes: Denies blurriness of vision, double vision or watery eyes Ears, nose, mouth, throat, and face: Denies mucositis or sore throat Respiratory: Denies cough, dyspnea or wheezes Cardiovascular: Denies palpitation, chest discomfort or lower extremity swelling Gastrointestinal:  Denies nausea, heartburn or change in bowel habits Skin: Denies abnormal skin rashes Lymphatics: Denies new lymphadenopathy but c/o + easy bruising Neurological:Denies numbness, tingling or new weaknesses Behavioral/Psych: Mood is stable, no new changes, anxious+ All other systems were reviewed with the patient and are negative.  PHYSICAL EXAMINATION: ECOG PERFORMANCE STATUS: 0-1  Filed Vitals:   09/16/14 1052  BP: 121/51  Pulse: 61  Temp: 98.1 F (36.7 C)  Resp: 17   Filed Weights   09/16/14 1052  Weight: 253 lb 1.6 oz (114.805 kg)    GENERAL:alert, no distress and comfortable SKIN: skin color, texture, turgor are normal, no rashes or significant lesions EYES: normal, conjunctiva are pink and non-injected, sclera clear OROPHARYNX:no exudate, no erythema and lips, buccal mucosa, and tongue normal  NECK: supple, thyroid normal size, non-tender, without nodularity LYMPH:  no palpable lymphadenopathy in the cervical, axillary or inguinal LUNGS: clear to auscultation and percussion with normal breathing effort HEART: regular rate & rhythm and no murmurs and no lower extremity edema ABDOMEN:abdomen soft, non-tender and normal bowel sounds Musculoskeletal:no cyanosis of digits and no clubbing  PSYCH: alert & oriented x 3 with fluent speech NEURO: no focal motor/sensory deficits  LABORATORY  DATA:  I have reviewed the data as listed Recent Results (from the past 2160 hour(s))  CBC WITH DIFFERENTIAL     Status: None   Collection Time    09/16/14 12:22 PM      Result Value Ref Range   WBC 9.8  3.9 - 10.3 10e3/uL   NEUT# 6.2  1.5 - 6.5 10e3/uL   HGB 12.2  11.6 - 15.9 g/dL   HCT 38.4  34.8 - 46.6 %   Platelets 253  145 - 400 10e3/uL   MCV 82.6  79.5 - 101.0 fL   MCH 26.2  25.1 - 34.0 pg   MCHC 31.8  31.5 - 36.0 g/dL   RBC 4.65  3.70 - 5.45 10e6/uL   RDW 14.3  11.2 - 14.5 %   lymph# 2.8  0.9 - 3.3 10e3/uL   MONO# 0.6  0.1 - 0.9 10e3/uL   Eosinophils Absolute 0.2  0.0 - 0.5 10e3/uL   Basophils Absolute 0.0  0.0 - 0.1 10e3/uL   NEUT% 62.8  38.4 - 76.8 %   LYMPH% 28.8  14.0 - 49.7 %   MONO% 6.0  0.0 - 14.0 %   EOS% 2.1  0.0 - 7.0 %   BASO% 0.3  0.0 - 2.0 %    ASSESSMENT: 53 years old female with following issues:  1. Anemia  2. Iron deficiency.  3. Generalized Musculoskeletal symptoms and Fibromyalgia.  4. Easy bruising.  5. Fatigue in setting of Gastric bypass surgery.  6. Elevated Citrate  PLAN:  1. Check CBC/diff and smear.  2. Repeat and ESR today.  3. Check Vitamin C level. I suspect would be low and will need to be replace. Scurvy can explain bone pain, dental and gum problems and easy bruising.  4. Check Ferritin and iron studies.  5. Trial of B12 shots x 6 months for fatigue and hx of gastric bypass surgery.  6. I believe lot of her problems are psychosocial and not hematological in nature.  7. RTC in 1 month.   All questions were answered. The  patient knows to call the clinic with any problems, questions or concerns. I spent  30 minutescounseling the patient face to face. The total time spent in the appointment was 45 minutes.    Bernadene Bell, MD Medical Hematologist/Oncologist [place] Pager: [phone] Office No: [phone]

## 2014-09-17 ENCOUNTER — Encounter: Payer: Self-pay | Admitting: Hematology

## 2014-09-17 NOTE — Progress Notes (Signed)
Addendum:  Patient called and stated that her sister with Ovarian cancer was BRCA positive. I have asked that she gets a copy of that report and bring it in her next appointment and if there is a BRCA mutation, we will refer patient for genetic counselling and arrange for her to get BRCA testing done.  Bernadene Bell, MD Medical Hematologist/Oncologist [place] Pager: [phone] Office No: [phone]

## 2014-09-20 LAB — PROTEIN ELECTROPHORESIS, SERUM, WITH REFLEX
ALPHA-1-GLOBULIN: 8.3 % — AB (ref 2.9–4.9)
ALPHA-2-GLOBULIN: 11.9 % — AB (ref 7.1–11.8)
Albumin ELP: 53.3 % — ABNORMAL LOW (ref 55.8–66.1)
BETA 2: 5.3 % (ref 3.2–6.5)
Beta Globulin: 8.3 % — ABNORMAL HIGH (ref 4.7–7.2)
Gamma Globulin: 12.9 % (ref 11.1–18.8)
Total Protein, Serum Electrophoresis: 6.5 g/dL (ref 6.0–8.3)

## 2014-09-20 LAB — SEDIMENTATION RATE: Sed Rate: 36 mm/hr — ABNORMAL HIGH (ref 0–22)

## 2014-09-20 LAB — VITAMIN C: VITAMIN C: 0.3 mg/dL (ref 0.2–1.5)

## 2014-09-24 ENCOUNTER — Emergency Department (HOSPITAL_BASED_OUTPATIENT_CLINIC_OR_DEPARTMENT_OTHER)
Admission: EM | Admit: 2014-09-24 | Discharge: 2014-09-24 | Disposition: A | Discharge status: Home / Self Care | Payer: Managed Care, Other (non HMO) | Attending: Emergency Medicine | Admitting: Emergency Medicine

## 2014-09-24 ENCOUNTER — Emergency Department (HOSPITAL_BASED_OUTPATIENT_CLINIC_OR_DEPARTMENT_OTHER): Payer: Managed Care, Other (non HMO)

## 2014-09-24 ENCOUNTER — Encounter (HOSPITAL_BASED_OUTPATIENT_CLINIC_OR_DEPARTMENT_OTHER): Payer: Self-pay | Admitting: Emergency Medicine

## 2014-09-24 DIAGNOSIS — E559 Vitamin D deficiency, unspecified: Secondary | ICD-10-CM | POA: Diagnosis not present

## 2014-09-24 DIAGNOSIS — Z872 Personal history of diseases of the skin and subcutaneous tissue: Secondary | ICD-10-CM | POA: Insufficient documentation

## 2014-09-24 DIAGNOSIS — Z9884 Bariatric surgery status: Secondary | ICD-10-CM | POA: Diagnosis not present

## 2014-09-24 DIAGNOSIS — Z791 Long term (current) use of non-steroidal anti-inflammatories (NSAID): Secondary | ICD-10-CM | POA: Insufficient documentation

## 2014-09-24 DIAGNOSIS — Z8719 Personal history of other diseases of the digestive system: Secondary | ICD-10-CM | POA: Insufficient documentation

## 2014-09-24 DIAGNOSIS — J45901 Unspecified asthma with (acute) exacerbation: Secondary | ICD-10-CM | POA: Insufficient documentation

## 2014-09-24 DIAGNOSIS — R079 Chest pain, unspecified: Secondary | ICD-10-CM | POA: Diagnosis present

## 2014-09-24 DIAGNOSIS — Z8619 Personal history of other infectious and parasitic diseases: Secondary | ICD-10-CM | POA: Diagnosis not present

## 2014-09-24 DIAGNOSIS — M549 Dorsalgia, unspecified: Secondary | ICD-10-CM | POA: Diagnosis not present

## 2014-09-24 DIAGNOSIS — Z8739 Personal history of other diseases of the musculoskeletal system and connective tissue: Secondary | ICD-10-CM | POA: Insufficient documentation

## 2014-09-24 DIAGNOSIS — Z86010 Personal history of colonic polyps: Secondary | ICD-10-CM | POA: Insufficient documentation

## 2014-09-24 DIAGNOSIS — Z79899 Other long term (current) drug therapy: Secondary | ICD-10-CM | POA: Insufficient documentation

## 2014-09-24 DIAGNOSIS — Z862 Personal history of diseases of the blood and blood-forming organs and certain disorders involving the immune mechanism: Secondary | ICD-10-CM | POA: Insufficient documentation

## 2014-09-24 DIAGNOSIS — R0789 Other chest pain: Secondary | ICD-10-CM | POA: Insufficient documentation

## 2014-09-24 DIAGNOSIS — Z87891 Personal history of nicotine dependence: Secondary | ICD-10-CM | POA: Diagnosis not present

## 2014-09-24 DIAGNOSIS — Z7952 Long term (current) use of systemic steroids: Secondary | ICD-10-CM | POA: Insufficient documentation

## 2014-09-24 DIAGNOSIS — Z87442 Personal history of urinary calculi: Secondary | ICD-10-CM | POA: Insufficient documentation

## 2014-09-24 MED ORDER — HYDROCODONE-ACETAMINOPHEN 5-325 MG PO TABS: 1.0000 | ORAL_TABLET | ORAL | Status: DC | PRN

## 2014-09-24 NOTE — ED Provider Notes (Signed)
CSN: [id number]     Arrival date & time 09/24/14  0809 History   First MD Initiated Contact with Patient 09/24/14 [phone]     Chief Complaint  Patient presents with  . Back Pain  . rib pain      (Consider location/radiation/quality/duration/timing/severity/associated sxs/prior Treatment) HPI Comments: Patient presents with bilateral rib pain. She states she has a three-month history of pain across her rib cage. She states it's both in the front and the back of her lips. It's worse with movements. Been fairly constant for the last 2-3 months. She has no pleuritic pain. She does have some exertional shortness of breath which has been present for the last 3+ months. She denies any chest tightness. She denies any leg swelling or calf pain. She is not on exogenous estrogens. She has no history of DVT in the past. She denies any injuries to her ribs. She states she's talked to her primary care provider in her spine doctor about the discomfort and they have thus far attributed it to her fibromyalgia.  Patient is a 53 y.o. female presenting with back pain.  Back Pain Associated symptoms: chest pain   Associated symptoms: no abdominal pain, no fever, no headaches, no numbness and no weakness     Past Medical History  Diagnosis Date  . Fibromyalgia   . Kidney stone   . Childhood asthma   . Chicken pox   . GERD (gastroesophageal reflux disease)   . Anemia   . Allergy     SEASONAL  . Blood transfusion without reported diagnosis   . Ulcer     2004  . Tubular adenoma of colon   . Avascular necrosis     knee,left  . Nephrolithiasis   . Asthma   . Vitamin D deficiency   . Osteopenia   . Eczema     hands  . Easy bruising   . ESR raised   . History of anemia   . H/O gastric bypass    Past Surgical History  Procedure Laterality Date  . Left knee replacement  01.2011  . Gastric bypass  2003  . Abdominal hysterectomy  2000    w/opph  . Appendectomy    . Cholecystectomy  2003  . Dental  surgery      All Upper Removed  . Breast biopsy Right 1997   Family History  Problem Relation Age of Onset  . Asthma Mother     Deceased  . Cancer - Other Mother     Esophageal  . Other Mother 54    Ruptured Bowel  . GER disease Mother   . Coronary artery disease Mother   . Rashes / Skin problems Mother   . Diabetes Father     Deceased  . Heart attack Father   . Coronary artery disease Father   . Throat cancer Maternal Grandmother     Deceased  . Cancer - Other Maternal Grandmother     throat  . Ovarian cancer Maternal Aunt     Deceased  . Healthy Brother   . Arthritis/Rheumatoid Daughter   . Rashes / Skin problems Daughter     psoriasis  . Psoriasis Daughter   . Cancer - Other Daughter     uterine, lung  . Ovarian cancer Sister    History  Substance Use Topics  . Smoking status: Former Smoker    Quit date: 02/15/2012  . Smokeless tobacco: Never Used     Comment: quit 09/2010  . Alcohol  Use: No   OB History   Grav Para Term Preterm Abortions TAB SAB Ect Mult Living                 Review of Systems  Constitutional: Negative for fever, chills, diaphoresis and fatigue.  HENT: Negative for congestion, rhinorrhea and sneezing.   Eyes: Negative.   Respiratory: Positive for shortness of breath. Negative for cough and chest tightness.   Cardiovascular: Positive for chest pain. Negative for leg swelling.  Gastrointestinal: Negative for nausea, vomiting, abdominal pain, diarrhea and blood in stool.  Genitourinary: Negative for frequency, hematuria, flank pain and difficulty urinating.  Musculoskeletal: Positive for back pain. Negative for arthralgias.  Skin: Negative for rash.  Neurological: Negative for dizziness, speech difficulty, weakness, numbness and headaches.      Allergies  Sodium pentobarbital  Home Medications   Prior to Admission medications   Medication Sig Start Date End Date Taking? Authorizing Provider  albuterol (PROVENTIL HFA;VENTOLIN HFA)  108 (90 BASE) MCG/ACT inhaler Inhale 2 puffs into the lungs every 6 (six) hours as needed for wheezing.    Historical Provider, MD  cyclobenzaprine (FLEXERIL) 10 MG tablet Take 1 tablet (10 mg total) by mouth 2 (two) times daily as needed for muscle spasms. 02/14/14   Ernestina Patches, MD  dexamethasone (DECADRON) 0.1 % ophthalmic suspension Place into both eyes 2 (two) times daily.    Historical Provider, MD  DULoxetine (CYMBALTA) 60 MG capsule Take 60 mg by mouth daily.    Historical Provider, MD  HYDROcodone-acetaminophen (NORCO) 5-325 MG per tablet Take 1 tablet by mouth every 6 (six) hours as needed. 02/14/14   Ernestina Patches, MD  HYDROcodone-acetaminophen (NORCO/VICODIN) 5-325 MG per tablet Take 1-2 tablets by mouth every 4 (four) hours as needed for moderate pain or severe pain. 09/24/14   Malvin Johns, MD  Multiple Vitamin (MULTIVITAMIN) tablet Take 1 tablet by mouth daily.    Historical Provider, MD  naproxen (NAPROSYN) 500 MG tablet Take 1 tablet (500 mg total) by mouth 2 (two) times daily with a meal. 02/14/14   Ernestina Patches, MD  predniSONE (DELTASONE) 5 MG tablet Take 5 mg by mouth daily as needed.    Historical Provider, MD  VITAMIN D, ERGOCALCIFEROL, PO Take by mouth.    Historical Provider, MD   BP 152/96  Pulse 50  Temp(Src) 98.5 F (36.9 C) (Oral)  Resp 16  Ht _0  (1.651 m)  Wt 230 lb (104.327 kg)  BMI 38.27 kg/m2  SpO2 100% Physical Exam  Constitutional: She is oriented to person, place, and time. She appears well-developed and well-nourished.  HENT:  Head: Normocephalic and atraumatic.  Eyes: Pupils are equal, round, and reactive to light.  Neck: Normal range of motion. Neck supple.  Cardiovascular: Normal rate, regular rhythm and normal heart sounds.   Pulmonary/Chest: Effort normal and breath sounds normal. No respiratory distress. She has no wheezes. She has no rales. She exhibits tenderness (tenderness across the lower rib cage anteriorly and posteriorly bilaterally.  No crepitus or deformity is noted.).  Abdominal: Soft. Bowel sounds are normal. There is no tenderness. There is no rebound and no guarding.  Musculoskeletal: Normal range of motion. She exhibits no edema.  Lymphadenopathy:    She has no cervical adenopathy.  Neurological: She is alert and oriented to person, place, and time.  Skin: Skin is warm and dry. No rash noted.  Psychiatric: She has a normal mood and affect.    ED Course  Procedures (including critical care time)  Labs Review Labs Reviewed - No data to display  Imaging Review Dg Chest 2 View  09/24/2014   CLINICAL DATA:  Acute Chest pain.  No known injury.  EXAM: CHEST  2 VIEW  COMPARISON:  12/08/2009  FINDINGS: The cardiac silhouette, mediastinal and hilar contours are within normal limits and stable. The lungs demonstrate mild chronic bronchitic changes, likely related to smoking. No infiltrates, edema or effusions the bony thorax is intact. Mild to moderate degenerative changes in the mid to lower thoracic spine.  IMPRESSION: No acute cardiopulmonary findings. Chronic bronchitic type lung changes.   Electronically Signed   By: Kalman Jewels M.D.   On: 09/24/2014 08:57     EKG Interpretation None      MDM   Final diagnoses:  Chest wall pain    Pt with a 3 month history of chest wall pain. There is no other symptoms that would be more suggestive of acute coronary syndrome or pulmonary embolus. Her oxygen saturation is normal. She has no abnormalities noted on a chest x-ray that would explain the symptoms. She was given a prescription for Vicodin for pain. She was encouraged to have a followup appointment with her primary care physician.    Malvin Johns, MD 09/24/14 [phone]

## 2014-09-24 NOTE — Discharge Instructions (Signed)

## 2014-09-24 NOTE — ED Notes (Signed)
Bilateral rib pain that started 3 months ago.  Has been evaluated by PMD and states this pain has been contributed to "fibromyalgia".  Had negative lumbar XR in ED.  States pain is constant, however worsens with movement or "turning".  Denies injury, recent illness, chest pain or SHOB.

## 2014-09-25 ENCOUNTER — Encounter: Payer: Self-pay | Admitting: Hematology

## 2014-09-26 ENCOUNTER — Encounter: Payer: Self-pay | Admitting: Hematology

## 2014-10-17 ENCOUNTER — Telehealth: Payer: Self-pay | Admitting: Hematology

## 2014-10-17 ENCOUNTER — Encounter: Payer: Self-pay | Admitting: Hematology

## 2014-10-17 ENCOUNTER — Ambulatory Visit (HOSPITAL_BASED_OUTPATIENT_CLINIC_OR_DEPARTMENT_OTHER): Payer: Managed Care, Other (non HMO) | Admitting: Hematology

## 2014-10-17 ENCOUNTER — Ambulatory Visit (HOSPITAL_BASED_OUTPATIENT_CLINIC_OR_DEPARTMENT_OTHER): Payer: Managed Care, Other (non HMO)

## 2014-10-17 ENCOUNTER — Other Ambulatory Visit: Payer: Self-pay

## 2014-10-17 ENCOUNTER — Telehealth: Payer: Self-pay | Admitting: *Deleted

## 2014-10-17 VITALS — BP 132/63 | HR 64 | Temp 98.2°F | Resp 19 | Ht 65.0 in | Wt 256.2 lb

## 2014-10-17 DIAGNOSIS — Z862 Personal history of diseases of the blood and blood-forming organs and certain disorders involving the immune mechanism: Secondary | ICD-10-CM

## 2014-10-17 DIAGNOSIS — D509 Iron deficiency anemia, unspecified: Secondary | ICD-10-CM

## 2014-10-17 DIAGNOSIS — D510 Vitamin B12 deficiency anemia due to intrinsic factor deficiency: Secondary | ICD-10-CM

## 2014-10-17 DIAGNOSIS — Z8481 Family history of carrier of genetic disease: Secondary | ICD-10-CM

## 2014-10-17 DIAGNOSIS — E54 Ascorbic acid deficiency: Secondary | ICD-10-CM | POA: Insufficient documentation

## 2014-10-17 MED ORDER — CYANOCOBALAMIN 1000 MCG/ML IJ SOLN
1000.0000 ug | Freq: Once | INTRAMUSCULAR | Status: AC
  Administered 2014-10-17: 1000 ug via INTRAMUSCULAR

## 2014-10-17 NOTE — Telephone Encounter (Signed)
Per staff message and POF I have scheduled appts. Advised scheduler of appts. JMW  

## 2014-10-17 NOTE — Progress Notes (Signed)
[facility]EMATOLOGY FOLLOW UP NOTE DATE OF VISIT: 10/17/2014  Patient Care Team: Brunetta Jeans, PA-C as PCP - General (Physician Assistant) [facility] [place] [address] [facility]  CHIEF COMPLAINTS/PURPOSE OF CONSULTATION:   Mild Leukocytosis, hx of anemia, Easy Bruising, history of Iron deficiency and hx of Gastric bypass surgery with question of nutritional deficiencies.  HISTORY OF PRESENTING ILLNESS:   Karla [name] 53 y.o. female from [place] is referred here for above problems and was initially evaluated on 09/16/2014. She has a history of fibromyalgia, chronic back pain, elevated ESR, teeth and gum problems requiring extraction of teeth, generalized bone pain especially in the spine area. She also bruises easily used to get heavy periods since she had her hysterectomy. She still have her ovaries. A sister was diagnosed with ovarian cancer in her 50s but patient is not aware of the results of BRCA testing which may be important for her to know. She does complain of having fatigue all the time, not feeling well and also applying for disability. She does take a multivitamin daily. She does not sleep well. She had severe arthritis in her left knee requiring a knee replacement in 2011. Her gastric bypass surgery was in 2000. She had a colonoscopy last year. There was a polyp removed in the next one will be scheduled in 5 years. She went to back specialists and they're planning to do MRI next month.  Patient is also been under a lot of stress because of her legal custody battle for her 32 year old granddaughter and patient finally have her possession now. On 12/15/2009 her hemoglobin was 9.8 g per crit 28.9 white count 8.6 and platelets were 175. The vitamin B12 level checked on 08/10/2013 was 1184, ferritin 15.4 TSH 0.08, folic acid more than 24, iron 40, transferrin 365, endomysial antibodies negative, tissue transglutaminase normal,  zinc level LXXX normal copper 133 normal vitamin B1 normal, vitamin B6 was normal and vitamin D was low at 27, vitamin 8 level was normal and vitamin E. was also normal, ESR was 45.  Patient is taking Dexilant 4 GERD, Cymbalta for fibromyalgia, ibuprofen for musculoskeletal pain,  Vitamin D and naproxen. Her mother was diagnosed with his facial cancer and a sister has ovarian cancer. Patient quit smoking in October 2011. She does not consume enough fruits and vegetables.  INTERVAL HISTORY:  Labs were done on 09/16/2014 and indicated a white count of 9.8, hemoglobin 12.2 g, hematocrit 38.4, platelets 253, MCV 82. Serum ferritin was low at 14, serum iron 44, TIBC 416 and iron saturation 10%.serum protein electrophoresis did not show any M spike.vitamin C level came back as low normal at 0.3 mg whereas the normal range is between 0.2-1.5 mg/dL. ESR was 36 down from 45 noted a year ago. Patient also started monthly B-12 injections for 6 months and today she disease her second injection. She still under a lot of stress.   MEDICAL HISTORY:  Past Medical History  Diagnosis Date  . Fibromyalgia   . Kidney stone   . Childhood asthma   . Chicken pox   . GERD (gastroesophageal reflux disease)   . Anemia   . Allergy     SEASONAL  . Blood transfusion without reported diagnosis   . Ulcer     2004  . Tubular adenoma of colon   . Avascular necrosis     knee,left  . Nephrolithiasis   . Asthma   .  Vitamin D deficiency   . Osteopenia   . Eczema     hands  . Easy bruising   . ESR raised   . History of anemia   . H/O gastric bypass   . ESR raised   . Vitamin C deficiency   . Iron deficiency     SURGICAL HISTORY: Past Surgical History  Procedure Laterality Date  . Left knee replacement  01.2011  . Gastric bypass  2003  . Abdominal hysterectomy  2000    w/opph  . Appendectomy    . Cholecystectomy  2003  . Dental surgery      All Upper Removed  . Breast biopsy Right 1997    SOCIAL  HISTORY: History   Social History  . Marital Status: Married    Spouse Name: N/A    Number of Children: N/A  . Years of Education: N/A   Occupational History  . Not on file.   Social History Main Topics  . Smoking status: Former Smoker    Quit date: 02/15/2012  . Smokeless tobacco: Never Used     Comment: quit 09/2010  . Alcohol Use: No  . Drug Use: No  . Sexual Activity: Yes    Birth Control/ Protection: Surgical   Other Topics Concern  . 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 Patient is on disability trying to maintain it. She won a cutody battle for a 65 year old grand daughter.    FAMILY HISTORY: Family History  Problem Relation Age of Onset  . Asthma Mother     Deceased  . Cancer - Other Mother     Esophageal  . Other Mother 67    Ruptured Bowel  . GER disease Mother   . Coronary artery disease Mother   . Rashes / Skin problems Mother   . Diabetes Father     Deceased  . Heart attack Father   . Coronary artery disease Father   . Throat cancer Maternal Grandmother     Deceased  . Cancer - Other Maternal Grandmother     throat  . Ovarian cancer Maternal Aunt     Deceased  . Healthy Brother   . Arthritis/Rheumatoid Daughter   . Rashes / Skin problems Daughter     psoriasis  . Psoriasis Daughter   . Cancer - Other Daughter     uterine, lung  . Ovarian cancer Sister     DIETARY HISTORY:  ALLERGIES:  is allergic to sodium pentobarbital.  MEDICATIONS:  Current Outpatient Prescriptions  Medication Sig Dispense Refill  . albuterol (PROVENTIL HFA;VENTOLIN HFA) 108 (90 BASE) MCG/ACT inhaler Inhale 2 puffs into the lungs every 6 (six) hours as needed for wheezing.    . cyclobenzaprine (FLEXERIL) 10 MG tablet Take 1 tablet (10 mg total) by mouth 2 (two) times daily as needed for muscle spasms. 20 tablet 0  . dexamethasone  (DECADRON) 0.1 % ophthalmic suspension Place into both eyes 2 (two) times daily.    . DULoxetine (CYMBALTA) 60 MG capsule Take 60 mg by mouth daily.    . fexofenadine-pseudoephedrine (ALLEGRA-D 24) 180-240 MG per 24 hr tablet Take 1 tablet by mouth daily as needed.    Marland Kitchen HYDROcodone-acetaminophen (NORCO) 5-325 MG per tablet Take 1 tablet by mouth every 6 (six) hours as needed. 10 tablet  0  . HYDROcodone-acetaminophen (NORCO/VICODIN) 5-325 MG per tablet Take 1-2 tablets by mouth every 4 (four) hours as needed for moderate pain or severe pain. 15 tablet 0  . Multiple Vitamin (MULTIVITAMIN) tablet Take 1 tablet by mouth daily.    . naproxen (NAPROSYN) 500 MG tablet Take 1 tablet (500 mg total) by mouth 2 (two) times daily with a meal. 20 tablet 0  . predniSONE (DELTASONE) 5 MG tablet Take 5 mg by mouth daily as needed.    Marland Kitchen VITAMIN D, ERGOCALCIFEROL, PO Take by mouth.     No current facility-administered medications for this visit.    REVIEW OF SYSTEMS:   Constitutional: Denies fevers, chills or abnormal night sweats, +fatigue Eyes: Denies blurriness of vision, double vision or watery eyes Ears, nose, mouth, throat, and face: Denies mucositis or sore throat Respiratory: Denies cough, dyspnea or wheezes Cardiovascular: Denies palpitation, chest discomfort or lower extremity swelling Gastrointestinal:  Denies nausea, heartburn or change in bowel habits Skin: Denies abnormal skin rashes Lymphatics: Denies new lymphadenopathy but c/o + easy bruising Neurological:Denies numbness, tingling or new weaknesses Behavioral/Psych: Mood is stable, no new changes, anxious+ All other systems were reviewed with the patient and are negative.  PHYSICAL EXAMINATION: ECOG PERFORMANCE STATUS: 0-1  Filed Vitals:   10/17/14 1040  BP: 132/63  Pulse: 64  Temp: 98.2 F (36.8 C)  Resp: 19   Filed Weights   10/17/14 1040  Weight: 256 lb 3.2 oz (116.212 kg)    GENERAL:alert, no distress and  comfortable SKIN: skin color, texture, turgor are normal, no rashes or significant lesions EYES: normal, conjunctiva are pink and non-injected, sclera clear OROPHARYNX:no exudate, no erythema and lips, buccal mucosa, and tongue normal  NECK: supple, thyroid normal size, non-tender, without nodularity LYMPH:  no palpable lymphadenopathy in the cervical, axillary or inguinal LUNGS: clear to auscultation and percussion with normal breathing effort HEART: regular rate & rhythm and no murmurs and no lower extremity edema ABDOMEN:abdomen soft, non-tender and normal bowel sounds Musculoskeletal:no cyanosis of digits and no clubbing  PSYCH: alert & oriented x 3 with fluent speech NEURO: no focal motor/sensory deficits  LABORATORY DATA:  I have reviewed the data as listed Recent Results (from the past 2160 hour(s))  CBC with Differential     Status: None   Collection Time: 09/16/14 12:22 PM  Result Value Ref Range   WBC 9.8 3.9 - 10.3 10e3/uL   NEUT# 6.2 1.5 - 6.5 10e3/uL   HGB 12.2 11.6 - 15.9 g/dL   HCT 38.4 34.8 - 46.6 %   Platelets 253 145 - 400 10e3/uL   MCV 82.6 79.5 - 101.0 fL   MCH 26.2 25.1 - 34.0 pg   MCHC 31.8 31.5 - 36.0 g/dL   RBC 4.65 3.70 - 5.45 10e6/uL   RDW 14.3 11.2 - 14.5 %   lymph# 2.8 0.9 - 3.3 10e3/uL   MONO# 0.6 0.1 - 0.9 10e3/uL   Eosinophils Absolute 0.2 0.0 - 0.5 10e3/uL   Basophils Absolute 0.0 0.0 - 0.1 10e3/uL   NEUT% 62.8 38.4 - 76.8 %   LYMPH% 28.8 14.0 - 49.7 %   MONO% 6.0 0.0 - 14.0 %   EOS% 2.1 0.0 - 7.0 %   BASO% 0.3 0.0 - 2.0 %  Ferritin     Status: None   Collection Time: 09/16/14 12:22 PM  Result Value Ref Range   Ferritin 14 9 - 269 ng/ml  Iron and TIBC [facility]     Status: Abnormal  Collection Time: 09/16/14 12:22 PM  Result Value Ref Range   Iron 44 41 - 142 ug/dL   TIBC 416 236 - 444 ug/dL   UIBC 373 120 - 384 ug/dL   %SAT 10 (L) 21 - 57 %  SPEP with reflex to IFE     Status: Abnormal   Collection Time: 09/16/14 12:22 PM  Result Value  Ref Range   Total Protein, Serum Electrophoresis 6.5 6.0 - 8.3 g/dL   Albumin ELP 53.3 (L) 55.8 - 66.1 %   Alpha-1-Globulin 8.3 (H) 2.9 - 4.9 %   Alpha-2-Globulin 11.9 (H) 7.1 - 11.8 %   Beta Globulin 8.3 (H) 4.7 - 7.2 %   Beta 2 5.3 3.2 - 6.5 %   Gamma Globulin 12.9 11.1 - 18.8 %   M-Spike, % NOT DET g/dL   SPE Interp. *     Comment: Nonspecific pattern.Reviewed by Odis Hollingshead, MD, PhD, FCAP (Electronic Signature onFile)   COMMENT (PROTEIN ELECTROPHOR) *     Comment: ---------------Serum protein electrophoresis is a useful screening procedure in thedetection of various pathophysiologic states such as inflammation,gammopathies, protein loss and other dysproteinemias.  Immunofixationelectrophoresis (IFE) is a more  sensitive technique for theidentification of M-proteins found in patients with monoclonalgammopathy of unknown significance (MGUS), amyloidosis, early ortreated myeloma or macroglobulinemia, solitary plasmacytoma orextramedullary plasmacytoma.  Vitamin C     Status: None   Collection Time: 09/16/14 12:22 PM  Result Value Ref Range   Vitamin C 0.3 0.2 - 1.5 mg/dL    Comment:   This test was developed and its performance characteristics have beendetermined by [employer], [place]. Performance characteristics refer to the analyticalperformance of the test.  Sedimentation rate     Status: Abnormal   Collection Time: 09/16/14 12:22 PM  Result Value Ref Range   Sed Rate 36 (H) 0 - 22 mm/hr    ASSESSMENT: 53 years old female with following issues:  1. Anemia  2. Iron deficiency.  3. Generalized Musculoskeletal symptoms and Fibromyalgia.  4. Easy bruising.  5. Fatigue in setting of Gastric bypass surgery.  6. Elevated Citrate  7. Vitamin C relative deficiency.  PLAN:  1. Feraheme infusion on 10/22/2014.  2. Start Vitamin C 1 Gram daily and after we check next level, we can taper down to 500 mg daily after that. I suspect low  vitamin C or Scurvy can explain bone pain, dental and gum problems and easy bruising. We also discussed about diet rich in Vitamin C.  4. Check CBC,  Ferritin, iron studies, ESR and Vitamin C level in 3 months.  5. Trial of B12 shots x 6 months for fatigue and hx of gastric bypass surgery. B 12 today and monthly.  6. I believe lot of her problems are psychosocial and not hematological in nature.  7. The elevated sedimentation rate is trending down and may continue to improve after iron and vitamin C replacement. I seen elevated markers of inflammation as a sign of vitamin C deficiency.  8. Refer patient to genetic counseling for a strong family history of malignancies and her sister with ovarian cancer carrying a BRCA mutation. If patients blood test is positive for mutation, one can discuss with her the options of bilateral mastectomy as well as bilateral oophorectomy.  9. RTC in 3 month.   All questions were answered. The patient knows to call the clinic with any problems, questions or concerns. I spent  30 minutescounseling the patient face to face. The total  time spent in the appointment was 45 minutes.    Bernadene Bell, MD Medical Hematologist/Oncologist [place] Pager: [phone] Office No: [phone]

## 2014-10-17 NOTE — Telephone Encounter (Signed)
Pt confirmed labs/ov per 11/12 POF,sent msg to add chemo, gave pt AVS...Marland KitchenMarland KitchenMarland Kitchen KJ

## 2014-10-17 NOTE — Patient Instructions (Signed)
Vitamin B12 Injections Every person needs vitamin B12. A deficiency develops when the body does not get enough of it. One way to overcome this is by getting B12 shots (injections). A B12 shot puts the vitamin directly into muscle tissue. This avoids any problems your body might have in absorbing it from food or a pill. In some people, the body has trouble using the vitamin correctly. This can cause a B12 deficiency. Not consuming enough of the vitamin can also cause a deficiency. Getting enough vitamin B12 can be hard for elderly people. Sometimes, they do not eat a well-balanced diet. The elderly are also more likely than younger people to have medical conditions or take medications that can lead to a deficiency. WHAT DOES VITAMIN B12 DO? Vitamin B12 does many things to help the body work right:  It helps the body make healthy red blood cells.  It helps maintain nerve cells.  It is involved in the body's process of converting food into energy (metabolism).  It is needed to make the genetic material in all cells (DNA). VITAMIN B12 FOOD SOURCES Most people get plenty of vitamin B12 through the foods they eat. It is present in:  Meat, fish, poultry, and eggs.  Milk and milk products.  It also is added when certain foods are made, including some breads, cereals and yogurts. The food is then called "fortified". CAUSES The most common causes of vitamin B12 deficiency are:  Pernicious anemia. The condition develops when the body cannot make enough healthy red blood cells. This stems from a lack of a protein made in the stomach (intrinsic factor). People without this protein cannot absorb enough vitamin B12 from food.  Malabsorption. This is when the body cannot absorb the vitamin. It can be caused by:  Pernicious anemia.  Surgery to remove part or all of the stomach can lead to malabsorption. Removal of part or all of the small intestine can also cause malabsorption.  Vegetarian diet.  People who are strict about not eating foods from animals could have trouble taking in enough vitamin B12 from diet alone.  Medications. Some medicines have been linked to B12 deficiency, such as Metformin (a drug prescribed for type 2 diabetes). Long-term use of stomach acid suppressants also can keep the vitamin from being absorbed.  Intestinal problems such as inflammatory bowel disease. If there are problems in the digestive tract, vitamin B12 may not be absorbed in good enough amounts. SYMPTOMS People who do not get enough B12 can develop problems. These can include:  Anemia. This is when the body has too few red blood cells. Red blood cells carry oxygen to the rest of the body. Without a healthy supply of red blood cells, people can feel:  Tired (fatigued).  Weak.  Severe anemia can cause:  Shortness of breath.  Dizziness.  Rapid heart rate.  Paleness.  Other Vitamin B12 deficiency symptoms include:  Diarrhea.  Numbness or tingling in the hands or feet.  Loss of appetite.  Confusion.  Sores on the tongue or in the mouth. LET YOUR CAREGIVER KNOW ABOUT:  Any allergies. It is very important to know if you are allergic or sensitive to cobalt. Vitamin B12 contains cobalt.  Any history of kidney disease.  All medications you are taking. Include prescription and over-the-counter medicines, herbs and creams.  Whether you are pregnant or breast-feeding.  If you have Leber's disease, a hereditary eye condition, vitamin B12 could make it worse. RISKS AND COMPLICATIONS Reactions to an injection are   usually temporary. They might include:  Pain at the injection site.  Redness, swelling or tenderness at the site.  Headache, dizziness or weakness.  Nausea, upset stomach or diarrhea.  Numbness or tingling.  Fever.  Joint pain.  Itching or rash. If a reaction does not go away in a short while, talk with your healthcare provider. A change in the way the shots are  given, or where they are given, might need to be made. BEFORE AN INJECTION To decide whether B12 injections are right for you, your healthcare provider will probably:  Ask about your medical history.  Ask questions about your diet.  Ask about symptoms such as:  Have you felt weak?  Do you feel unusually tired?  Do you get dizzy?  Order blood tests. These may include a test to:  Check the level of red cells in your blood.  Measure B12 levels.  Check for the presence of intrinsic factor. VITAMIN B12 INJECTIONS How often you will need a vitamin B12 injection will depend on how severe your deficiency is. This also will affect how long you will need to get them. People with pernicious anemia usually get injections for their entire life. Others might get them for a shorter period. For many people, injections are given daily or weekly for several weeks. Then, once B12 levels are normal, injections are given just once a month. If the cause of the deficiency can be fixed, the injections can be stopped. Talk with your healthcare provider about what you should expect. For an injection:  The injection site will be cleaned with an alcohol swab.  Your healthcare provider will insert a needle directly into a muscle. Most any muscle can be used. Most often, an arm muscle is used. A buttocks muscle can also be used. Many people say shots in that area are less painful.  A small adhesive bandage may be put over the injection site. It usually can be taken off in an hour or less. Injections can be given by your healthcare provider. In some cases, family members give them. Sometimes, people give them to themselves. Talk with your healthcare provider about what would be best for you. If someone other than your healthcare provider will be giving the shots, the person will need to be trained to give them correctly. HOME CARE INSTRUCTIONS   You can remove the adhesive bandage within an hour of getting a  shot.  You should be able to go about your normal activities right away.  Avoid drinking large amounts of alcohol while taking vitamin B12 shots. Alcohol can interfere with the body's use of the vitamin. SEEK MEDICAL CARE IF:   Pain, redness, swelling or tenderness at the injection site does not get better or gets worse.  Headache, dizziness or weakness does not go away.  You develop a fever of more than 100.5 F (38.1 C). SEEK IMMEDIATE MEDICAL CARE IF:   You have chest pain.  You develop shortness of breath.  You have muscle weakness that gets worse.  You develop numbness, weakness or tingling on one side or one area of the body.  You have symptoms of an allergic reaction, such as:  Hives.  Difficulty breathing.  Swelling of the lips, face, tongue or throat.  You develop a fever of more than 102.0 F (38.9 C). MAKE SURE YOU:   Understand these instructions.  Will watch your condition.  Will get help right away if you are not doing well or get worse. Document   Released: 02/18/2009 Document Revised: 02/14/2012 Document Reviewed: 02/18/2009 Exit[employer] Patient Information 2015 [employer]. This information is not intended to replace advice given to you by your health care provider. Make sure you discuss any questions you have with your health care provider.  

## 2014-10-17 NOTE — Telephone Encounter (Signed)
Pt called lft msg that scheduler had not called her concerning her chemo treatment for 11/17 I called pt back and advised her of her time but I didn't have the time earlier at her visit that it had to be sent to chemo to be added......Marland Kitchen KJ

## 2014-10-22 ENCOUNTER — Ambulatory Visit (HOSPITAL_BASED_OUTPATIENT_CLINIC_OR_DEPARTMENT_OTHER): Payer: Managed Care, Other (non HMO)

## 2014-10-22 DIAGNOSIS — Z862 Personal history of diseases of the blood and blood-forming organs and certain disorders involving the immune mechanism: Secondary | ICD-10-CM

## 2014-10-22 MED ORDER — FERUMOXYTOL INJECTION 510 MG/17 ML
1020.0000 mg | Freq: Once | INTRAVENOUS | Status: AC
  Administered 2014-10-22: 1020 mg via INTRAVENOUS
  Filled 2014-10-22: qty 34

## 2014-10-22 MED ORDER — SODIUM CHLORIDE 0.9 % IV SOLN
Freq: Once | INTRAVENOUS | Status: AC
  Administered 2014-10-22: 10:00:00 via INTRAVENOUS

## 2014-10-22 NOTE — Patient Instructions (Signed)

## 2014-10-29 DIAGNOSIS — R079 Chest pain, unspecified: Secondary | ICD-10-CM | POA: Insufficient documentation

## 2014-10-30 ENCOUNTER — Other Ambulatory Visit: Payer: Self-pay | Admitting: Family Medicine

## 2014-10-30 DIAGNOSIS — R079 Chest pain, unspecified: Secondary | ICD-10-CM

## 2014-11-05 ENCOUNTER — Ambulatory Visit
Admission: RE | Admit: 2014-11-05 | Discharge: 2014-11-05 | Disposition: A | Discharge status: Home / Self Care | Payer: Managed Care, Other (non HMO) | Source: Ambulatory Visit | Attending: Family Medicine | Admitting: Family Medicine

## 2014-11-05 DIAGNOSIS — R079 Chest pain, unspecified: Secondary | ICD-10-CM

## 2014-11-05 MED ORDER — IOHEXOL 300 MG/ML  SOLN
100.0000 mL | Freq: Once | INTRAMUSCULAR | Status: AC | PRN
  Administered 2014-11-05: 100 mL via INTRAVENOUS

## 2014-11-12 ENCOUNTER — Telehealth: Payer: Self-pay | Admitting: Hematology

## 2014-11-12 ENCOUNTER — Telehealth: Payer: Self-pay | Admitting: Oncology

## 2014-11-12 DIAGNOSIS — G63 Polyneuropathy in diseases classified elsewhere: Secondary | ICD-10-CM

## 2014-11-12 DIAGNOSIS — E538 Deficiency of other specified B group vitamins: Secondary | ICD-10-CM | POA: Insufficient documentation

## 2014-11-12 DIAGNOSIS — M797 Fibromyalgia: Secondary | ICD-10-CM | POA: Insufficient documentation

## 2014-11-12 DIAGNOSIS — M8920 Other disorders of bone development and growth, unspecified site: Secondary | ICD-10-CM | POA: Insufficient documentation

## 2014-11-12 DIAGNOSIS — J4520 Mild intermittent asthma, uncomplicated: Secondary | ICD-10-CM | POA: Insufficient documentation

## 2014-11-12 DIAGNOSIS — J45909 Unspecified asthma, uncomplicated: Secondary | ICD-10-CM | POA: Insufficient documentation

## 2014-11-12 NOTE — Telephone Encounter (Signed)
Pt called and r/s appt from 11/14/14 to 11/21/14. Pt confirm.

## 2014-11-13 ENCOUNTER — Ambulatory Visit: Payer: Managed Care, Other (non HMO) | Admitting: Internal Medicine

## 2014-11-14 ENCOUNTER — Encounter: Payer: Self-pay | Admitting: Cardiology

## 2014-11-14 ENCOUNTER — Ambulatory Visit (INDEPENDENT_AMBULATORY_CARE_PROVIDER_SITE_OTHER): Payer: Managed Care, Other (non HMO) | Admitting: Cardiology

## 2014-11-14 ENCOUNTER — Ambulatory Visit: Payer: Managed Care, Other (non HMO)

## 2014-11-14 VITALS — BP 126/82 | HR 76 | Ht 65.0 in | Wt 256.8 lb

## 2014-11-14 DIAGNOSIS — Z01812 Encounter for preprocedural laboratory examination: Secondary | ICD-10-CM

## 2014-11-14 DIAGNOSIS — I209 Angina pectoris, unspecified: Secondary | ICD-10-CM

## 2014-11-14 DIAGNOSIS — I447 Left bundle-branch block, unspecified: Secondary | ICD-10-CM

## 2014-11-14 DIAGNOSIS — I2510 Atherosclerotic heart disease of native coronary artery without angina pectoris: Secondary | ICD-10-CM

## 2014-11-14 DIAGNOSIS — I2584 Coronary atherosclerosis due to calcified coronary lesion: Secondary | ICD-10-CM

## 2014-11-14 DIAGNOSIS — R0600 Dyspnea, unspecified: Secondary | ICD-10-CM

## 2014-11-14 DIAGNOSIS — R0602 Shortness of breath: Secondary | ICD-10-CM

## 2014-11-14 LAB — CBC WITH DIFFERENTIAL/PLATELET
BASOS ABS: 0.1 10*3/uL (ref 0.0–0.1)
Basophils Relative: 1.2 % (ref 0.0–3.0)
EOS ABS: 0.2 10*3/uL (ref 0.0–0.7)
Eosinophils Relative: 1.8 % (ref 0.0–5.0)
HCT: 39.2 % (ref 36.0–46.0)
Hemoglobin: 12.8 g/dL (ref 12.0–15.0)
Lymphocytes Relative: 28 % (ref 12.0–46.0)
Lymphs Abs: 3 10*3/uL (ref 0.7–4.0)
MCHC: 32.7 g/dL (ref 30.0–36.0)
MCV: 85.4 fl (ref 78.0–100.0)
Monocytes Absolute: 0.5 10*3/uL (ref 0.1–1.0)
Monocytes Relative: 4.6 % (ref 3.0–12.0)
Neutro Abs: 6.9 10*3/uL (ref 1.4–7.7)
Neutrophils Relative %: 64.4 % (ref 43.0–77.0)
Platelets: 229 10*3/uL (ref 150.0–400.0)
RBC: 4.59 Mil/uL (ref 3.87–5.11)
RDW: 18.7 % — AB (ref 11.5–15.5)
WBC: 10.7 10*3/uL — ABNORMAL HIGH (ref 4.0–10.5)

## 2014-11-14 LAB — BASIC METABOLIC PANEL
BUN: 19 mg/dL (ref 6–23)
CALCIUM: 9.2 mg/dL (ref 8.4–10.5)
CO2: 21 meq/L (ref 19–32)
Chloride: 107 mEq/L (ref 96–112)
Creatinine, Ser: 0.7 mg/dL (ref 0.4–1.2)
GFR: 93.65 mL/min (ref >60.00)
Glucose, Bld: 108 mg/dL — ABNORMAL HIGH (ref 70–99)
Potassium: 4 mEq/L (ref 3.5–5.1)
SODIUM: 136 meq/L (ref 135–145)

## 2014-11-14 LAB — PROTIME-INR
INR: 1 ratio (ref 0.8–1.0)
PROTHROMBIN TIME: 11.1 s (ref 9.6–13.1)

## 2014-11-14 LAB — APTT: aPTT: 31.7 s (ref 23.4–32.7)

## 2014-11-14 MED ORDER — ASPIRIN EC 81 MG PO TBEC: 81.0000 mg | DELAYED_RELEASE_TABLET | Freq: Every day | ORAL | Status: DC

## 2014-11-14 NOTE — Progress Notes (Signed)
[address] [place], [place]  [address] Phone: [phone] Fax:  [phone]  Date:  11/14/2014   ID:  Ithzel Fedorchak, DOB 1960/12/28, MRN [id number]  PCP:  Leeanne Rio, PA-C  Cardiologist:  Fransico Him, MD    History of Present Illness: Karla [name] is a 53 y.o. female with a history GERD, fibromyalgia with chronic chest wall pain, Asthma by PFTs, iron deficiency anemia who presents today for evaluation of abnormal chest CT.  She has had some rib cage pain and had a chest CT and it noted some coronary artery calcifications in the LAD and RCA and her PCP referred her for further evaluation.  She says that she has noticed some chest pressure in the past when walking up hills along with SOB.  Her husband says that she gets SOB just walking from the parking lot to the store.  She occasionally notices some ankle edema after sitting for a while.  She denies any palpitations, lightheadedness or syncope.  Her father died with an MI in his 16's.  Her mom has extensive CAD and had her first MI in her 57's.   Wt Readings from Last 3 Encounters:  11/14/14 256 lb 12.8 oz (116.484 kg)  10/17/14 256 lb 3.2 oz (116.212 kg)  09/24/14 230 lb (104.327 kg)     Past Medical History  Diagnosis Date  . Fibromyalgia   . Kidney stone   . Childhood asthma   . Chicken pox   . GERD (gastroesophageal reflux disease)   . Anemia   . Allergy     SEASONAL  . Blood transfusion without reported diagnosis   . Ulcer     2004  . Tubular adenoma of colon   . Avascular necrosis     knee,left  . Nephrolithiasis   . Asthma   . Vitamin D deficiency   . Osteopenia   . Eczema     hands  . Easy bruising   . ESR raised   . History of anemia   . H/O gastric bypass   . ESR raised   . Vitamin C deficiency   . Iron deficiency   . Avascular necrosis     LEFT KNEE    Current Outpatient Prescriptions  Medication Sig Dispense Refill  . Ascorbic Acid (VITAMIN C) 1000 MG  tablet Take 1,000 mg by mouth daily.    . Cyanocobalamin (VITAMIN B-12 IJ) Inject as directed every 30 (thirty) days.    . meloxicam (MOBIC) 15 MG tablet 1 tablet    . Multiple Vitamins-Minerals (MULTIVITAMIN WITH MINERALS) tablet Take 1 tablet by mouth daily.    . pantoprazole (PROTONIX) 40 MG tablet Take 40 mg by mouth daily.    . DULoxetine (CYMBALTA) 60 MG capsule Take 60 mg by mouth daily.     No current facility-administered medications for this visit.    Allergies:    Allergies  Allergen Reactions  . Sodium Pentobarbital [Pentobarbital] Other (See Comments)    Severe nausea.    Social History:  The patient  reports that she quit smoking about 2 years ago. She has never used smokeless tobacco. She reports that she does not drink alcohol or use illicit drugs.   Family History:  The patient's family history includes Arthritis/Rheumatoid in her daughter; Asthma in her mother; Cancer - Other in her daughter, maternal grandmother, and mother; Coronary artery disease in her father and mother; Diabetes in her father; GER disease in her mother;  Healthy in her brother; Heart attack in her father; Other (age of onset: 75) in her mother; Ovarian cancer in her maternal aunt and sister; Psoriasis in her daughter; Rashes / Skin problems in her daughter and mother; Throat cancer in her maternal grandmother.   ROS:  Please see the history of present illness.      All other systems reviewed and negative.   PHYSICAL EXAM: VS:  BP 126/82 mmHg  Pulse 76  Ht 5' 5"  (1.651 m)  Wt 256 lb 12.8 oz (116.484 kg)  BMI 42.73 kg/m2 Well nourished, well developed, in no acute distress HEENT: normal Neck: no JVD Cardiac:  normal S1, S2; RRR; no murmur Lungs:  clear to auscultation bilaterally, no wheezing, rhonchi or rales Abd: soft, nontender, no hepatomegaly Ext: no edema Skin: warm and dry Neuro:  CNs 2-12 intact, no focal abnormalities noted  EKG:     NSR with LBBB  ASSESSMENT AND PLAN:  1.   Severe coronary artery calcifications on chest CT.  She has had some exertional chest pain but she also has chronic chest wall pain from her fibromyalgia.  She has has some DOE when walking which could be an anginal equivalent.  EKG shows LBBB. She has a very strong family history of CAD with MI and early death in both her parent.  Cardiac catheterization was discussed with the patient fully including risks on myocardial infarction, death, stroke, bleeding, arrhythmia, dye allergy, renal insufficiency or bleeding.  All patient questions and concerns were discussed and the patient understands and is willing to proceed.  Start ASA 48m daily.  Check FLP and HbA1C.   2.  DOE - see above - need to rule out ischemia.  I have recommended checking a 2D echo 3.  LBBB 4.  Fibromyalgia with chronic chest wall pain  Followup pending results of cath  Signed, TFransico Him MD [facility] 11/14/2014 2:35 PM

## 2014-11-14 NOTE — Patient Instructions (Addendum)
Your physician has requested that you have a cardiac catheterization. Cardiac catheterization is used to diagnose and/or treat various heart conditions. Doctors may recommend this procedure for a number of different reasons. The most common reason is to evaluate chest pain. Chest pain can be a symptom of coronary artery disease (CAD), and cardiac catheterization can show whether plaque is narrowing or blocking your heart's arteries. This procedure is also used to evaluate the valves, as well as measure the blood flow and oxygen levels in different parts of your heart. For further information please visit [email or web address]. Please follow instruction sheet, as given.  Your physician has requested that you have an echocardiogram. Echocardiography is a painless test that uses sound waves to create images of your heart. It provides your doctor with information about the size and shape of your heart and how well your heart's chambers and valves are working. This procedure takes approximately one hour. There are no restrictions for this procedure.  Your physician has recommended you make the following change in your medication:  1) START Aspirin 81 mg daily  Your physician recommends that you return for FASTING LAB WORk (LIPIDS, A1C)  Your physician recommends that you have lab work TODAY (BMET, CBC, PT/INR)  Your physician recommends that you schedule a follow-up appointment after your catheterization.

## 2014-11-15 ENCOUNTER — Encounter (HOSPITAL_COMMUNITY): Payer: Self-pay | Admitting: Cardiovascular Disease

## 2014-11-15 ENCOUNTER — Encounter (HOSPITAL_COMMUNITY)
Admission: RE | Disposition: A | Discharge status: Home / Self Care | Payer: Self-pay | Source: Ambulatory Visit | Attending: Interventional Cardiology

## 2014-11-15 ENCOUNTER — Ambulatory Visit (HOSPITAL_COMMUNITY)
Admission: RE | Admit: 2014-11-15 | Discharge: 2014-11-15 | Disposition: A | Discharge status: Home / Self Care | Payer: Managed Care, Other (non HMO) | Source: Ambulatory Visit | Attending: Interventional Cardiology | Admitting: Interventional Cardiology

## 2014-11-15 DIAGNOSIS — D509 Iron deficiency anemia, unspecified: Secondary | ICD-10-CM | POA: Diagnosis not present

## 2014-11-15 DIAGNOSIS — Z825 Family history of asthma and other chronic lower respiratory diseases: Secondary | ICD-10-CM | POA: Insufficient documentation

## 2014-11-15 DIAGNOSIS — Z87891 Personal history of nicotine dependence: Secondary | ICD-10-CM | POA: Diagnosis not present

## 2014-11-15 DIAGNOSIS — R0789 Other chest pain: Secondary | ICD-10-CM | POA: Insufficient documentation

## 2014-11-15 DIAGNOSIS — K219 Gastro-esophageal reflux disease without esophagitis: Secondary | ICD-10-CM | POA: Diagnosis not present

## 2014-11-15 DIAGNOSIS — Z8261 Family history of arthritis: Secondary | ICD-10-CM | POA: Diagnosis not present

## 2014-11-15 DIAGNOSIS — I2510 Atherosclerotic heart disease of native coronary artery without angina pectoris: Secondary | ICD-10-CM | POA: Diagnosis not present

## 2014-11-15 DIAGNOSIS — R079 Chest pain, unspecified: Secondary | ICD-10-CM

## 2014-11-15 DIAGNOSIS — M797 Fibromyalgia: Secondary | ICD-10-CM | POA: Diagnosis not present

## 2014-11-15 DIAGNOSIS — I447 Left bundle-branch block, unspecified: Secondary | ICD-10-CM | POA: Insufficient documentation

## 2014-11-15 DIAGNOSIS — J45909 Unspecified asthma, uncomplicated: Secondary | ICD-10-CM | POA: Diagnosis not present

## 2014-11-15 DIAGNOSIS — G8929 Other chronic pain: Secondary | ICD-10-CM | POA: Insufficient documentation

## 2014-11-15 DIAGNOSIS — Z87442 Personal history of urinary calculi: Secondary | ICD-10-CM | POA: Diagnosis not present

## 2014-11-15 HISTORY — PX: LEFT HEART CATHETERIZATION WITH CORONARY ANGIOGRAM: SHX5451

## 2014-11-15 SURGERY — LEFT HEART CATHETERIZATION WITH CORONARY ANGIOGRAM
Anesthesia: LOCAL

## 2014-11-15 MED ORDER — SODIUM CHLORIDE 0.9 % IV SOLN: 250.0000 mL | INTRAVENOUS | Status: DC | PRN

## 2014-11-15 MED ORDER — SODIUM CHLORIDE 0.9 % IJ SOLN: 3.0000 mL | Freq: Two times a day (BID) | INTRAMUSCULAR | Status: DC

## 2014-11-15 MED ORDER — SODIUM CHLORIDE 0.9 % IV SOLN
INTRAVENOUS | Status: DC
  Administered 2014-11-15: 11:00:00 via INTRAVENOUS

## 2014-11-15 MED ORDER — LIDOCAINE HCL (PF) 1 % IJ SOLN
INTRAMUSCULAR | Status: AC
  Filled 2014-11-15: qty 30

## 2014-11-15 MED ORDER — HEPARIN (PORCINE) IN NACL 2-0.9 UNIT/ML-% IJ SOLN
INTRAMUSCULAR | Status: AC
  Filled 2014-11-15: qty 1500

## 2014-11-15 MED ORDER — FENTANYL CITRATE 0.05 MG/ML IJ SOLN
INTRAMUSCULAR | Status: AC
  Filled 2014-11-15: qty 2

## 2014-11-15 MED ORDER — SODIUM CHLORIDE 0.9 % IV SOLN: INTRAVENOUS | Status: DC

## 2014-11-15 MED ORDER — ASPIRIN EC 81 MG PO TBEC: 81.0000 mg | DELAYED_RELEASE_TABLET | Freq: Every day | ORAL | Status: DC

## 2014-11-15 MED ORDER — SODIUM CHLORIDE 0.9 % IJ SOLN
3.0000 mL | INTRAMUSCULAR | Status: DC | PRN
  Administered 2014-11-15: 3 mL via INTRAVENOUS
  Filled 2014-11-15: qty 3

## 2014-11-15 MED ORDER — ONDANSETRON HCL 4 MG/2ML IJ SOLN: 4.0000 mg | Freq: Four times a day (QID) | INTRAMUSCULAR | Status: DC | PRN

## 2014-11-15 MED ORDER — ASPIRIN 81 MG PO CHEW
CHEWABLE_TABLET | ORAL | Status: AC
  Filled 2014-11-15: qty 1

## 2014-11-15 MED ORDER — MIDAZOLAM HCL 2 MG/2ML IJ SOLN
INTRAMUSCULAR | Status: AC
  Filled 2014-11-15: qty 2

## 2014-11-15 MED ORDER — ACETAMINOPHEN 325 MG PO TABS: 650.0000 mg | ORAL_TABLET | ORAL | Status: DC | PRN

## 2014-11-15 MED ORDER — NITROGLYCERIN 1 MG/10 ML FOR IR/CATH LAB
INTRA_ARTERIAL | Status: AC
  Filled 2014-11-15: qty 10

## 2014-11-15 MED ORDER — ASPIRIN 81 MG PO CHEW
81.0000 mg | CHEWABLE_TABLET | ORAL | Status: AC
  Administered 2014-11-15: 81 mg via ORAL

## 2014-11-15 NOTE — Interval H&P Note (Signed)
Cath Lab Visit (complete for each Cath Lab visit)  Clinical Evaluation Leading to the Procedure:   ACS: No.  Non-ACS:    Anginal Classification: CCS II  Anti-ischemic medical therapy: Minimal Therapy (1 class of medications).  Non-Invasive Test Results: Equivocal test results Advance coronary calcification for age on CT angio.   Prior CABG: No previous CABG      History and Physical Interval Note:  11/15/2014 1:50 PM  Karla [name]  has presented today for surgery, with the diagnosis of cp, sob, bundle branch  The various methods of treatment have been discussed with the patient and family. After consideration of risks, benefits and other options for treatment, the patient has consented to  Procedure(s): LEFT HEART CATHETERIZATION WITH CORONARY ANGIOGRAM (N/A) as a surgical intervention .  The patient's history has been reviewed, patient examined, no change in status, stable for surgery.  I have reviewed the patient's chart and labs.  Questions were answered to the patient's satisfaction.     Rigley Niess A

## 2014-11-15 NOTE — Discharge Instructions (Signed)
Radial Site Care °Refer to this sheet in the next few weeks. These instructions provide you with information on caring for yourself after your procedure. Your caregiver may also give you more specific instructions. Your treatment has been planned according to current medical practices, but problems sometimes occur. Call your caregiver if you have any problems or questions after your procedure. °HOME CARE INSTRUCTIONS °· You may shower the day after the procedure. Remove the bandage (dressing) and gently wash the site with plain soap and water. Gently pat the site dry. °· Do not apply powder or lotion to the site. °· Do not submerge the affected site in water for 3 to 5 days. °· Inspect the site at least twice daily. °· Do not flex or bend the affected arm for 24 hours. °· No lifting over 5 pounds (2.3 kg) for 5 days after your procedure. °· Do not drive home if you are discharged the same day of the procedure. Have someone else drive you. °· You may drive 24 hours after the procedure unless otherwise instructed by your caregiver. °· Do not operate machinery or power tools for 24 hours. °· A responsible adult should be with you for the first 24 hours after you arrive home. °What to expect: °· Any bruising will usually fade within 1 to 2 weeks. °· Blood that collects in the tissue (hematoma) may be painful to the touch. It should usually decrease in size and tenderness within 1 to 2 weeks. °SEEK IMMEDIATE MEDICAL CARE IF: °· You have unusual pain at the radial site. °· You have redness, warmth, swelling, or pain at the radial site. °· You have drainage (other than a small amount of blood on the dressing). °· You have chills. °· You have a fever or persistent symptoms for more than 72 hours. °· You have a fever and your symptoms suddenly get worse. °· Your arm becomes pale, cool, tingly, or numb. °· You have heavy bleeding from the site. Hold pressure on the site. °Document Released: 12/25/2010 Document Revised:  02/14/2012 Document Reviewed: 12/25/2010 °Exit[employer]® Patient Information ©2015 [employer]. This information is not intended to replace advice given to you by your health care provider. Make sure you discuss any questions you have with your health care provider. ° °

## 2014-11-15 NOTE — H&P (View-Only) (Signed)
[address] [place], [place]  [address] Phone: [phone] Fax:  [phone]  Date:  11/14/2014   ID:  Karla [name], DOB [birth year], MRN [id number]  PCP:  Leeanne Rio, PA-C  Cardiologist:  Fransico Him, MD    History of Present Illness: Karla [name] is a 53 y.o. female with a history GERD, fibromyalgia with chronic chest wall pain, Asthma by PFTs, iron deficiency anemia who presents today for evaluation of abnormal chest CT.  She has had some rib cage pain and had a chest CT and it noted some coronary artery calcifications in the LAD and RCA and her PCP referred her for further evaluation.  She says that she has noticed some chest pressure in the past when walking up hills along with SOB.  Her husband says that she gets SOB just walking from the parking lot to the store.  She occasionally notices some ankle edema after sitting for a while.  She denies any palpitations, lightheadedness or syncope.  Her father died with an MI in his 76's.  Her mom has extensive CAD and had her first MI in her 47's.   Wt Readings from Last 3 Encounters:  11/14/14 256 lb 12.8 oz (116.484 kg)  10/17/14 256 lb 3.2 oz (116.212 kg)  09/24/14 230 lb (104.327 kg)     Past Medical History  Diagnosis Date  . Fibromyalgia   . Kidney stone   . Childhood asthma   . Chicken pox   . GERD (gastroesophageal reflux disease)   . Anemia   . Allergy     SEASONAL  . Blood transfusion without reported diagnosis   . Ulcer     2004  . Tubular adenoma of colon   . Avascular necrosis     knee,left  . Nephrolithiasis   . Asthma   . Vitamin D deficiency   . Osteopenia   . Eczema     hands  . Easy bruising   . ESR raised   . History of anemia   . H/O gastric bypass   . ESR raised   . Vitamin C deficiency   . Iron deficiency   . Avascular necrosis     LEFT KNEE    Current Outpatient Prescriptions  Medication Sig Dispense Refill  . Ascorbic Acid (VITAMIN C) 1000 MG  tablet Take 1,000 mg by mouth daily.    . Cyanocobalamin (VITAMIN B-12 IJ) Inject as directed every 30 (thirty) days.    . meloxicam (MOBIC) 15 MG tablet 1 tablet    . Multiple Vitamins-Minerals (MULTIVITAMIN WITH MINERALS) tablet Take 1 tablet by mouth daily.    . pantoprazole (PROTONIX) 40 MG tablet Take 40 mg by mouth daily.    . DULoxetine (CYMBALTA) 60 MG capsule Take 60 mg by mouth daily.     No current facility-administered medications for this visit.    Allergies:    Allergies  Allergen Reactions  . Sodium Pentobarbital [Pentobarbital] Other (See Comments)    Severe nausea.    Social History:  The patient  reports that she quit smoking about 2 years ago. She has never used smokeless tobacco. She reports that she does not drink alcohol or use illicit drugs.   Family History:  The patient's family history includes Arthritis/Rheumatoid in her daughter; Asthma in her mother; Cancer - Other in her daughter, maternal grandmother, and mother; Coronary artery disease in her father and mother; Diabetes in her father; GER disease in her mother;  Healthy in her brother; Heart attack in her father; Other (age of onset: 71) in her mother; Ovarian cancer in her maternal aunt and sister; Psoriasis in her daughter; Rashes / Skin problems in her daughter and mother; Throat cancer in her maternal grandmother.   ROS:  Please see the history of present illness.      All other systems reviewed and negative.   PHYSICAL EXAM: VS:  BP 126/82 mmHg  Pulse 76  Ht 5' 5"  (1.651 m)  Wt 256 lb 12.8 oz (116.484 kg)  BMI 42.73 kg/m2 Well nourished, well developed, in no acute distress HEENT: normal Neck: no JVD Cardiac:  normal S1, S2; RRR; no murmur Lungs:  clear to auscultation bilaterally, no wheezing, rhonchi or rales Abd: soft, nontender, no hepatomegaly Ext: no edema Skin: warm and dry Neuro:  CNs 2-12 intact, no focal abnormalities noted  EKG:     NSR with LBBB  ASSESSMENT AND PLAN:  1.   Severe coronary artery calcifications on chest CT.  She has had some exertional chest pain but she also has chronic chest wall pain from her fibromyalgia.  She has has some DOE when walking which could be an anginal equivalent.  EKG shows LBBB. She has a very strong family history of CAD with MI and early death in both her parent.  Cardiac catheterization was discussed with the patient fully including risks on myocardial infarction, death, stroke, bleeding, arrhythmia, dye allergy, renal insufficiency or bleeding.  All patient questions and concerns were discussed and the patient understands and is willing to proceed.  Start ASA 81m daily.  Check FLP and HbA1C.   2.  DOE - see above - need to rule out ischemia.  I have recommended checking a 2D echo 3.  LBBB 4.  Fibromyalgia with chronic chest wall pain  Followup pending results of cath  Signed, TFransico Him MD [facility] 11/14/2014 2:35 PM

## 2014-11-15 NOTE — CV Procedure (Addendum)
Karla [name] is a 53 y.o. female   [id number]  [id number] LOCATION:  FACILITY: [facility]  PHYSICIAN: Troy Sine, MD, [facility] June 14, 1961   DATE OF PROCEDURE:  11/15/2014     CARDIAC CATHETERIZATION    HISTORY:   Karla [name] is a 53 year old female who has a history of GERD, fibromyalgia, as well as chest wall pain.  She has a history of asthma as well as iron deficiency anemia.  She was found to have coronary calcification which was felt to be advanced for her age on a chest CT.  She has left bundle branch block.  She is referred by Dr. Radford Pax for definitive cardiac catheterization.   PROCEDURE:  Left heart catheterization via the right radial artery: Coronary angiography, left ventriculography.  The patient was brought to the second floor [facility] cath lab in the postabsorptive state. The patient was premedicated with Versed 4 mg and fentanyl 75 mcg due to marked anxiety prior to the procedure. A right radial approach was utilized after an Allen's test verified adequate circulation. The right radial artery was punctured via the Seldinger technique, and a 6 [place] Glidesheath Slender was inserted without difficulty.  A radial cocktail consisting of Verapamil, IV nitroglycerin, and lidocaine was administered. Weight adjusted heparin was administered. A safety J wire was advanced into the ascending aorta. Diagnostic catheterization was done with 5 [place] TIG 4.0 catheters. A 5 French pigtail catheter was used for left ventriculography. A TR radial band was applied for hemostasis. The patient left the catheterization laboratory in stable condition.   HEMODYNAMICS:   Central Aorta: 138/60  Left Ventricle: 138/19  ANGIOGRAPHY:   Flouroscopy revealed mild-to-moderate calcification in the proximal LAD.  There was minimal calcification in the RCA.  The left main coronary artery was  a common ostium and immediately bifurcated into the LAD and left circumflex  coronary artery.   The LAD had mild to moderate proximal calcification and mild 10% narrowing after the first septal perforating artery and prior to the first diagonal vessel.  The vessel gave rise to 2 diagonal vessels and extended to the LV apex.   The left circumflex coronary artery was angiographically normal  codominant vessel and gave rise to one major bifurcating obtuse marginal branch.   The RCA was minimal calcification with 10% proximal to mid luminal irregularity as was a normal codominant vessel   Left ventriculography revealed normal global LV contractility without focal segmental wall motion abnormalities. There was no evidence for mitral regurgitation.  Ejection fraction is 55%.   Total contrast used: 70 cc Omnipaque  IMPRESSION:  Normal left ventricular function with an ejection fraction at 55%.  Coronary calcification with mild nonobstructive CAD with mild to moderate calcification in the proximal LAD with mild 10% narrowing after the septal perforating artery and prior to the first diagonal vessel; normal left circumflex coronary artery; and minimal calcification in the RCA with 10% luminal irregularity/stenosis in the proximal segment prior to the acute marginal branch.  RECOMMENDATION:  Medical therapy including aspirin and aggressive statin therapy to reduce potential for CAD progression in this patient with nonobstructive CAD and coronary calcification.   Troy Sine, MD, [facility] 11/15/2014 2:45 PM

## 2014-11-19 ENCOUNTER — Telehealth: Payer: Self-pay

## 2014-11-19 ENCOUNTER — Other Ambulatory Visit (INDEPENDENT_AMBULATORY_CARE_PROVIDER_SITE_OTHER): Payer: Managed Care, Other (non HMO) | Admitting: *Deleted

## 2014-11-19 ENCOUNTER — Ambulatory Visit (HOSPITAL_COMMUNITY): Payer: Managed Care, Other (non HMO) | Attending: Cardiology

## 2014-11-19 DIAGNOSIS — I209 Angina pectoris, unspecified: Secondary | ICD-10-CM

## 2014-11-19 DIAGNOSIS — I2510 Atherosclerotic heart disease of native coronary artery without angina pectoris: Secondary | ICD-10-CM

## 2014-11-19 DIAGNOSIS — R0600 Dyspnea, unspecified: Secondary | ICD-10-CM | POA: Diagnosis present

## 2014-11-19 DIAGNOSIS — I447 Left bundle-branch block, unspecified: Secondary | ICD-10-CM

## 2014-11-19 DIAGNOSIS — E785 Hyperlipidemia, unspecified: Secondary | ICD-10-CM

## 2014-11-19 DIAGNOSIS — I2584 Coronary atherosclerosis due to calcified coronary lesion: Secondary | ICD-10-CM

## 2014-11-19 LAB — LIPID PANEL
CHOL/HDL RATIO: 4
Cholesterol: 158 mg/dL (ref 0–200)
HDL: 39.1 mg/dL (ref >39.00)
LDL CALC: 91 mg/dL (ref 0–99)
NonHDL: 118.9
Triglycerides: 141 mg/dL (ref 0.0–149.0)
VLDL: 28.2 mg/dL (ref 0.0–40.0)

## 2014-11-19 LAB — HEMOGLOBIN A1C: Hgb A1c MFr Bld: 5.4 % (ref 4.6–6.5)

## 2014-11-19 LAB — ALT: ALT: 32 U/L (ref 0–35)

## 2014-11-19 MED ORDER — ATORVASTATIN CALCIUM 40 MG PO TABS: 40.0000 mg | ORAL_TABLET | Freq: Every day | ORAL | Status: DC

## 2014-11-19 NOTE — Progress Notes (Signed)
2D Echo completed. 11/19/2014

## 2014-11-19 NOTE — Telephone Encounter (Signed)
Patient informed of lab results and verbal understanding expressed.  Lipitor 40 mg daily ordered. Fasting lab appointment made for Feb 1. Patient agrees with treatment plan.

## 2014-11-19 NOTE — Telephone Encounter (Signed)
-----   Message from Sueanne Margarita, MD sent at 11/19/2014  2:42 PM EST ----- LDL elevated - goal is <70 with CAD.  Please have patient start Lipitor 40mg  daily and recheck FLP and ALT in 6 weeks

## 2014-11-21 ENCOUNTER — Encounter: Payer: Self-pay | Admitting: Genetic Counselor

## 2014-11-21 ENCOUNTER — Ambulatory Visit (HOSPITAL_BASED_OUTPATIENT_CLINIC_OR_DEPARTMENT_OTHER): Payer: Self-pay | Admitting: Genetic Counselor

## 2014-11-21 ENCOUNTER — Ambulatory Visit (HOSPITAL_BASED_OUTPATIENT_CLINIC_OR_DEPARTMENT_OTHER): Payer: Managed Care, Other (non HMO)

## 2014-11-21 ENCOUNTER — Ambulatory Visit: Payer: Managed Care, Other (non HMO)

## 2014-11-21 DIAGNOSIS — Z8041 Family history of malignant neoplasm of ovary: Secondary | ICD-10-CM

## 2014-11-21 DIAGNOSIS — Z9884 Bariatric surgery status: Secondary | ICD-10-CM

## 2014-11-21 DIAGNOSIS — Z803 Family history of malignant neoplasm of breast: Secondary | ICD-10-CM | POA: Insufficient documentation

## 2014-11-21 DIAGNOSIS — Z862 Personal history of diseases of the blood and blood-forming organs and certain disorders involving the immune mechanism: Secondary | ICD-10-CM

## 2014-11-21 DIAGNOSIS — Z315 Encounter for genetic counseling: Secondary | ICD-10-CM

## 2014-11-21 DIAGNOSIS — R5383 Other fatigue: Secondary | ICD-10-CM

## 2014-11-21 MED ORDER — CYANOCOBALAMIN 1000 MCG/ML IJ SOLN
1000.0000 ug | Freq: Once | INTRAMUSCULAR | Status: AC
  Administered 2014-11-21: 1000 ug via INTRAMUSCULAR

## 2014-11-21 NOTE — Progress Notes (Signed)
Patient Name: Karla [name] Patient Age: 53 y.o. Encounter Date: 11/21/2014  Referring Physician: Bernadene Bell, MD  Primary Care Provider: Leeanne Rio, PA-C   [name], a 53 y.o. female, is being seen at the [facility] due to a family history of ovarian cancer. She presents to clinic today to discuss the possibility of a hereditary predisposition to cancer and discuss whether genetic testing is warranted.  HISTORY OF PRESENT ILLNESS: [name] has no personal history of cancer. She stated that she has a yearly mammogram, clinical breast exam and gynecologic exam. She had a negative colonoscopy in 2011 after her gastric bypass surgery. She reported that one polyp was identified this year and she was told to have her next colonoscopy in 5 years.   [name] reported that she had a hysterectomy (ovaries intact) in 2000 due to excessive bleeding and a ruptured abscess.   Past Medical History  Diagnosis Date  . Fibromyalgia   . Kidney stone   . Childhood asthma   . Chicken pox   . GERD (gastroesophageal reflux disease)   . Anemia   . Allergy     SEASONAL  . Blood transfusion without reported diagnosis   . Ulcer     2004  . Tubular adenoma of colon   . Avascular necrosis     knee,left  . Nephrolithiasis   . Asthma   . Vitamin D deficiency   . Osteopenia   . Eczema     hands  . Easy bruising   . ESR raised   . History of anemia   . H/O gastric bypass   . ESR raised   . Vitamin C deficiency   . Iron deficiency   . Avascular necrosis     LEFT KNEE  . Family history of ovarian cancer     Past Surgical History  Procedure Laterality Date  . Left knee replacement  01.2011  . Gastric bypass  2003  . Abdominal hysterectomy  2000    w/opph  . Appendectomy    . Cholecystectomy  2003  . Dental surgery      All Upper Removed  . Breast biopsy Right 1997  . Left heart catheterization with coronary angiogram N/A  11/15/2014    Procedure: LEFT HEART CATHETERIZATION WITH CORONARY ANGIOGRAM;  Surgeon: Troy Sine, MD;  Location: [facility] CATH LAB;  Service: Cardiovascular;  Laterality: N/A;    History   Social History  . Marital Status: Married    Spouse Name: N/A    Number of Children: N/A  . Years of Education: N/A   Social History Main Topics  . Smoking status: Former Smoker    Quit date: 02/15/2012  . Smokeless tobacco: Never Used     Comment: quit 09/2010  . Alcohol Use: No  . Drug Use: No  . Sexual Activity: Yes    Birth Control/ Protection: Surgical   Other Topics Concern  . 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 Patient is on disability trying to maintain it. She won a cutody battle for a 84 year old grand daughter.     FAMILY HISTORY:   During the visit, a 4-generation pedigree was obtained. Family tree will be sent for scanning and will be in EPIC under  the Media tab.  Significant diagnoses include the following:  Family History  Problem Relation Age of Onset  . Asthma Mother     Deceased  . Cancer - Other Mother     Esophageal 39s; deceased 98; smoker  . Other Mother 71    Ruptured Bowel  . GER disease Mother   . Coronary artery disease Mother   . Rashes / Skin problems Mother   . Heart disease Mother   . Heart attack Mother   . Diabetes Father     Deceased  . Heart attack Father   . Coronary artery disease Father   . Heart disease Father   . Throat cancer Maternal Grandmother     Deceased 43s  . Cancer - Other Maternal Grandmother     throat; deceased 76s  . Ovarian cancer Maternal Aunt     Deceased late 76s  . Healthy Brother   . Arthritis/Rheumatoid Daughter   . Rashes / Skin problems Daughter     psoriasis  . Psoriasis Daughter   . Ovarian cancer Sister     38yo; maternal half-sister; "gene positive"  . Cancer - Other  Paternal Grandmother     "female cancer"; deceased 44s  . Cancer - Other Maternal Aunt     "female cancer" 32s    Additionally, [name] has one daughter (age 61) who is cancer-free. She has one full brother and two maternal half-sisters. Reportedly, one sister had ovarian cancer and tested positive for a mutation (gene unknown). We have no documentation of results today.  [name] ancestry is Caucasian - NOS. There is no known Jewish ancestry and no consanguinity.  ASSESSMENT AND PLAN: [name] is a 53 y.o. female with a family history of ovarian cancer in her maternal half-sister and a maternal aunt. She also reports "female cancer" in another maternal aunt and her paternal grandmother. This history is suggestive of a hereditary predisposition to cancer, but she is not a very accurate historian. She reports that she cannot obtain genetic test results from her sister, but was told it was "positive". We reviewed the characteristics, features and inheritance patterns of hereditary cancer syndromes. We also discussed genetic testing, including the process of testing, insurance coverage and implications of results. A negative result will be considered reassuring, but without a copy of her sister's genetic test results, we would not consider her a "true negative".  [name] wished to pursue genetic testing and a blood sample will be sent to [facility] for analysis of the 24 genes on the OvaNext gene panel (ATM, BARD1, BRCA1, BRCA2, BRIP1, CDH1, CHEK2, EPCAM, MLH1, MRE11A, MSH2, MSH6, MUTYH, NBN, NF1, PALB2, PMS2, PTEN, RAD50, RAD51C, RAD51D, SMARCA4, STK11, and TP53). We discussed the implications of a positive, negative and/ or Variant of Uncertain Significance (VUS) result. Results should be available in approximately 4-5 weeks, at which point we will contact her and address implications for her as well as address genetic testing for at-risk family members, if needed.     We encouraged [name] to remain in contact with Cancer Genetics annually so that we can update the family history and inform her of any changes in cancer genetics and testing that may be of benefit for this family. Ms.  Karla [name] questions were answered to her satisfaction today.   Thank you for the referral and allowing [place] to share in the care of your patient.   The patient was seen for a total  of 30 minutes, greater than 50% of which was spent face-to-face counseling. This patient was discussed with the overseeing provider who agrees with the above.   Steele Berg, MS, [place] Certified Genetic Counselor phone: [phone] [name]

## 2014-11-21 NOTE — Patient Instructions (Signed)
Vitamin B12 Injections Every person needs vitamin B12. A deficiency develops when the body does not get enough of it. One way to overcome this is by getting B12 shots (injections). A B12 shot puts the vitamin directly into muscle tissue. This avoids any problems your body might have in absorbing it from food or a pill. In some people, the body has trouble using the vitamin correctly. This can cause a B12 deficiency. Not consuming enough of the vitamin can also cause a deficiency. Getting enough vitamin B12 can be hard for elderly people. Sometimes, they do not eat a well-balanced diet. The elderly are also more likely than younger people to have medical conditions or take medications that can lead to a deficiency. WHAT DOES VITAMIN B12 DO? Vitamin B12 does many things to help the body work right:  It helps the body make healthy red blood cells.  It helps maintain nerve cells.  It is involved in the body's process of converting food into energy (metabolism).  It is needed to make the genetic material in all cells (DNA). VITAMIN B12 FOOD SOURCES Most people get plenty of vitamin B12 through the foods they eat. It is present in:  Meat, fish, poultry, and eggs.  Milk and milk products.  It also is added when certain foods are made, including some breads, cereals and yogurts. The food is then called "fortified". CAUSES The most common causes of vitamin B12 deficiency are:  Pernicious anemia. The condition develops when the body cannot make enough healthy red blood cells. This stems from a lack of a protein made in the stomach (intrinsic factor). People without this protein cannot absorb enough vitamin B12 from food.  Malabsorption. This is when the body cannot absorb the vitamin. It can be caused by:  Pernicious anemia.  Surgery to remove part or all of the stomach can lead to malabsorption. Removal of part or all of the small intestine can also cause malabsorption.  Vegetarian diet.  People who are strict about not eating foods from animals could have trouble taking in enough vitamin B12 from diet alone.  Medications. Some medicines have been linked to B12 deficiency, such as Metformin (a drug prescribed for type 2 diabetes). Long-term use of stomach acid suppressants also can keep the vitamin from being absorbed.  Intestinal problems such as inflammatory bowel disease. If there are problems in the digestive tract, vitamin B12 may not be absorbed in good enough amounts. SYMPTOMS People who do not get enough B12 can develop problems. These can include:  Anemia. This is when the body has too few red blood cells. Red blood cells carry oxygen to the rest of the body. Without a healthy supply of red blood cells, people can feel:  Tired (fatigued).  Weak.  Severe anemia can cause:  Shortness of breath.  Dizziness.  Rapid heart rate.  Paleness.  Other Vitamin B12 deficiency symptoms include:  Diarrhea.  Numbness or tingling in the hands or feet.  Loss of appetite.  Confusion.  Sores on the tongue or in the mouth. LET YOUR CAREGIVER KNOW ABOUT:  Any allergies. It is very important to know if you are allergic or sensitive to cobalt. Vitamin B12 contains cobalt.  Any history of kidney disease.  All medications you are taking. Include prescription and over-the-counter medicines, herbs and creams.  Whether you are pregnant or breast-feeding.  If you have Leber's disease, a hereditary eye condition, vitamin B12 could make it worse. RISKS AND COMPLICATIONS Reactions to an injection are   usually temporary. They might include:  Pain at the injection site.  Redness, swelling or tenderness at the site.  Headache, dizziness or weakness.  Nausea, upset stomach or diarrhea.  Numbness or tingling.  Fever.  Joint pain.  Itching or rash. If a reaction does not go away in a short while, talk with your healthcare provider. A change in the way the shots are  given, or where they are given, might need to be made. BEFORE AN INJECTION To decide whether B12 injections are right for you, your healthcare provider will probably:  Ask about your medical history.  Ask questions about your diet.  Ask about symptoms such as:  Have you felt weak?  Do you feel unusually tired?  Do you get dizzy?  Order blood tests. These may include a test to:  Check the level of red cells in your blood.  Measure B12 levels.  Check for the presence of intrinsic factor. VITAMIN B12 INJECTIONS How often you will need a vitamin B12 injection will depend on how severe your deficiency is. This also will affect how long you will need to get them. People with pernicious anemia usually get injections for their entire life. Others might get them for a shorter period. For many people, injections are given daily or weekly for several weeks. Then, once B12 levels are normal, injections are given just once a month. If the cause of the deficiency can be fixed, the injections can be stopped. Talk with your healthcare provider about what you should expect. For an injection:  The injection site will be cleaned with an alcohol swab.  Your healthcare provider will insert a needle directly into a muscle. Most any muscle can be used. Most often, an arm muscle is used. A buttocks muscle can also be used. Many people say shots in that area are less painful.  A small adhesive bandage may be put over the injection site. It usually can be taken off in an hour or less. Injections can be given by your healthcare provider. In some cases, family members give them. Sometimes, people give them to themselves. Talk with your healthcare provider about what would be best for you. If someone other than your healthcare provider will be giving the shots, the person will need to be trained to give them correctly. HOME CARE INSTRUCTIONS   You can remove the adhesive bandage within an hour of getting a  shot.  You should be able to go about your normal activities right away.  Avoid drinking large amounts of alcohol while taking vitamin B12 shots. Alcohol can interfere with the body's use of the vitamin. SEEK MEDICAL CARE IF:   Pain, redness, swelling or tenderness at the injection site does not get better or gets worse.  Headache, dizziness or weakness does not go away.  You develop a fever of more than 100.5 F (38.1 C). SEEK IMMEDIATE MEDICAL CARE IF:   You have chest pain.  You develop shortness of breath.  You have muscle weakness that gets worse.  You develop numbness, weakness or tingling on one side or one area of the body.  You have symptoms of an allergic reaction, such as:  Hives.  Difficulty breathing.  Swelling of the lips, face, tongue or throat.  You develop a fever of more than 102.0 F (38.9 C). MAKE SURE YOU:   Understand these instructions.  Will watch your condition.  Will get help right away if you are not doing well or get worse. Document   Released: 02/18/2009 Document Revised: 02/14/2012 Document Reviewed: 02/18/2009 Exit[employer] Patient Information 2015 [employer]. This information is not intended to replace advice given to you by your health care provider. Make sure you discuss any questions you have with your health care provider.  

## 2014-12-11 ENCOUNTER — Telehealth: Payer: Self-pay | Admitting: Hematology

## 2014-12-11 NOTE — Telephone Encounter (Signed)
returned call and s.w. pt husband and r/s appt to 1.8  ok and aware of new d.t

## 2014-12-12 ENCOUNTER — Ambulatory Visit: Payer: Managed Care, Other (non HMO)

## 2014-12-13 ENCOUNTER — Ambulatory Visit: Payer: Managed Care, Other (non HMO) | Admitting: Cardiovascular Disease

## 2014-12-13 ENCOUNTER — Ambulatory Visit (HOSPITAL_BASED_OUTPATIENT_CLINIC_OR_DEPARTMENT_OTHER): Payer: BLUE CROSS/BLUE SHIELD

## 2014-12-13 DIAGNOSIS — Z9884 Bariatric surgery status: Secondary | ICD-10-CM

## 2014-12-13 DIAGNOSIS — R5383 Other fatigue: Secondary | ICD-10-CM

## 2014-12-13 DIAGNOSIS — Z862 Personal history of diseases of the blood and blood-forming organs and certain disorders involving the immune mechanism: Secondary | ICD-10-CM

## 2014-12-13 MED ORDER — CYANOCOBALAMIN 1000 MCG/ML IJ SOLN
1000.0000 ug | Freq: Once | INTRAMUSCULAR | Status: AC
  Administered 2014-12-13: 1000 ug via INTRAMUSCULAR

## 2014-12-13 NOTE — Patient Instructions (Signed)

## 2014-12-24 ENCOUNTER — Telehealth: Payer: Self-pay | Admitting: Hematology

## 2014-12-24 ENCOUNTER — Encounter: Payer: Self-pay | Admitting: Genetic Counselor

## 2014-12-24 DIAGNOSIS — Z1379 Encounter for other screening for genetic and chromosomal anomalies: Secondary | ICD-10-CM | POA: Insufficient documentation

## 2014-12-24 NOTE — Telephone Encounter (Signed)
, °

## 2014-12-24 NOTE — Progress Notes (Addendum)
GENETIC TEST RESULTS  Patient Name: Karla [name] Patient Age: 54 y.o. Encounter Date: 12/24/2014  Referring Physician: Bernadene Bell, MD   Ms. Jodean Valade was called today to discuss genetic test results. Please see the Genetics note from her visit on 11/21/2014 for a detailed discussion of her personal and family history.  GENETIC TESTING: At the time of Ms. Talley Inman's visit, we recommended she pursue genetic testing of multiple genes on the OvaNext gene panel. This test, which included sequencing and deletion/duplication analysis of 24 genes, was performed at [facility]. Testing was did not reveal any clearly pathogenic mutation in these genes. The genes tested were ATM, BARD1, BRCA1, BRCA2, BRIP1, CDH1, CHEK2, EPCAM, MLH1, MRE11A, MSH2, MSH6, MUTYH, NBN, NF1, PALB2, PMS2, PTEN, RAD50, RAD51C, RAD51D, SMARCA4, STK11, and TP53.  We discussed with Ms. Liz Malady that since the current test is not perfect, it is possible there may be a gene mutation that current testing cannot detect, but that chance is small. We also discussed that it is possible that a different genetic factor, which was not part of this testing or has not yet been discovered, is responsible for the cancer diagnoses in the family. Should Ms. Liz Malady wish to discuss or pursue this additional testing, we are happy to coordinate this at any time, but do not feel that she is at significant risk of harboring a mutation in a different gene.    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 At this time, it is unknown if this variant is associated with increased cancer risk or if this is a normal finding, but most variants such as this get reclassified to being inconsequential. It should not be used to make medical management decisions. Of note, the NBN gene has not been associated with ovarian cancer. With time, we suspect the lab will determine the  significance of this variant, if any. If we do learn more about it, we will try to contact Ms. Liz Malady to discuss it further. However, it is important to stay in touch with [place] periodically and keep the address and phone number up to date. ADDENDUM: An updated report was received today from [facility] indicating the the NBN VUS previously identified in Ms. 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 A copy of this new report will be sent to get scanned and mailed to her.  Ms. Kevonna Nolte indicated that her maternal half-sister has a mutation, but she is unable to obtain documentation as to which gene is involved. It may be that her sister also has this VUS, but miscommunicated that information as a mutation. She indicated that her sister is unwilling to share her results with the family.  CANCER SCREENING: This result is generally reassuring and indicates that Ms. Fayola Meckes does not likely have an increased risk of cancer due to a mutation in one of these genes. We recommended Ms. Dontavia Brand continue to follow the cancer screening guidelines provided by her primary physician.   FAMILY MEMBERS: It is strongly recommended to try and obtain a copy of her half-sister's genetic test results as it can inform the proper testing for others in the family.  Family members should not pursue testing for the above VUS outside of a research setting as it has no implications for their medical management.  Lastly, we discussed with Ms. Liz Malady that cancer genetics is a rapidly advancing field and it is  possible that new genetic tests will be appropriate for her in the future. We encouraged her to remain in contact with [place] on an annual basis so we can update her personal and family histories, and let her know of advances in cancer genetics that may benefit the family. Our contact number was provided. Ms. Yatziri Wainwright questions were answered to her satisfaction today, and she knows she  is welcome to call anytime with additional questions.    Steele Berg, MS, [place] Certified Genetic Counselor phone: [phone] [email or web address]

## 2015-01-06 ENCOUNTER — Other Ambulatory Visit: Payer: BLUE CROSS/BLUE SHIELD

## 2015-01-08 ENCOUNTER — Other Ambulatory Visit: Payer: Self-pay | Admitting: *Deleted

## 2015-01-08 DIAGNOSIS — E54 Ascorbic acid deficiency: Secondary | ICD-10-CM

## 2015-01-08 DIAGNOSIS — D509 Iron deficiency anemia, unspecified: Secondary | ICD-10-CM

## 2015-01-08 DIAGNOSIS — Z862 Personal history of diseases of the blood and blood-forming organs and certain disorders involving the immune mechanism: Secondary | ICD-10-CM

## 2015-01-09 ENCOUNTER — Ambulatory Visit (HOSPITAL_BASED_OUTPATIENT_CLINIC_OR_DEPARTMENT_OTHER): Payer: BLUE CROSS/BLUE SHIELD | Admitting: Hematology

## 2015-01-09 ENCOUNTER — Telehealth: Payer: Self-pay | Admitting: Hematology

## 2015-01-09 ENCOUNTER — Encounter: Payer: Self-pay | Admitting: Hematology

## 2015-01-09 ENCOUNTER — Other Ambulatory Visit (HOSPITAL_BASED_OUTPATIENT_CLINIC_OR_DEPARTMENT_OTHER): Payer: BLUE CROSS/BLUE SHIELD

## 2015-01-09 ENCOUNTER — Ambulatory Visit (HOSPITAL_BASED_OUTPATIENT_CLINIC_OR_DEPARTMENT_OTHER): Payer: BLUE CROSS/BLUE SHIELD

## 2015-01-09 VITALS — BP 130/64 | HR 64 | Temp 98.0°F | Resp 18 | Ht 65.0 in | Wt 252.1 lb

## 2015-01-09 DIAGNOSIS — E611 Iron deficiency: Secondary | ICD-10-CM

## 2015-01-09 DIAGNOSIS — E54 Ascorbic acid deficiency: Secondary | ICD-10-CM

## 2015-01-09 DIAGNOSIS — D509 Iron deficiency anemia, unspecified: Secondary | ICD-10-CM

## 2015-01-09 DIAGNOSIS — R5383 Other fatigue: Secondary | ICD-10-CM

## 2015-01-09 DIAGNOSIS — Z9884 Bariatric surgery status: Secondary | ICD-10-CM

## 2015-01-09 DIAGNOSIS — R233 Spontaneous ecchymoses: Secondary | ICD-10-CM

## 2015-01-09 DIAGNOSIS — Z862 Personal history of diseases of the blood and blood-forming organs and certain disorders involving the immune mechanism: Secondary | ICD-10-CM

## 2015-01-09 DIAGNOSIS — M797 Fibromyalgia: Secondary | ICD-10-CM

## 2015-01-09 LAB — CBC WITH DIFFERENTIAL/PLATELET
BASO%: 0.3 % (ref 0.0–2.0)
BASOS ABS: 0 10*3/uL (ref 0.0–0.1)
EOS%: 2.3 % (ref 0.0–7.0)
Eosinophils Absolute: 0.2 10*3/uL (ref 0.0–0.5)
HCT: 38.1 % (ref 34.8–46.6)
HGB: 12.4 g/dL (ref 11.6–15.9)
LYMPH%: 25.1 % (ref 14.0–49.7)
MCH: 29 pg (ref 25.1–34.0)
MCHC: 32.5 g/dL (ref 31.5–36.0)
MCV: 89.2 fL (ref 79.5–101.0)
MONO#: 0.4 10*3/uL (ref 0.1–0.9)
MONO%: 3.7 % (ref 0.0–14.0)
NEUT#: 6.8 10*3/uL — ABNORMAL HIGH (ref 1.5–6.5)
NEUT%: 68.6 % (ref 38.4–76.8)
Platelets: 268 10*3/uL (ref 145–400)
RBC: 4.27 10*6/uL (ref 3.70–5.45)
RDW: 14.4 % (ref 11.2–14.5)
WBC: 9.9 10*3/uL (ref 3.9–10.3)
lymph#: 2.5 10*3/uL (ref 0.9–3.3)

## 2015-01-09 LAB — IRON AND TIBC CHCC
%SAT: 11 % — ABNORMAL LOW (ref 21–57)
Iron: 37 ug/dL — ABNORMAL LOW (ref 41–142)
TIBC: 336 ug/dL (ref 236–444)
UIBC: 299 ug/dL (ref 120–384)

## 2015-01-09 LAB — FERRITIN CHCC: FERRITIN: 39 ng/mL (ref 9–269)

## 2015-01-09 MED ORDER — CYANOCOBALAMIN 1000 MCG/ML IJ SOLN
1000.0000 ug | Freq: Once | INTRAMUSCULAR | Status: AC
  Administered 2015-01-09: 1000 ug via INTRAMUSCULAR

## 2015-01-09 NOTE — Progress Notes (Signed)
[facility] HEMATOLOGY FOLLOW UP NOTE DATE OF VISIT: 10/17/2014  Patient Care Team: Brunetta Jeans, PA-C as PCP - General (Physician Assistant) [facility] [place] [facility]  CHIEF COMPLAINTS Follow up Easy Bruising and history of Iron deficiency    HISTORY OF PRESENTING ILLNESS:   Karla [name] 54 y.o. female from [place] is referred here for above problems and was initially evaluated on 09/16/2014. She has a history of fibromyalgia, chronic back pain, elevated ESR, teeth and gum problems requiring extraction of teeth, generalized bone pain especially in the spine area. She also bruises easily used to get heavy periods since she had her hysterectomy. She still have her ovaries. A sister was diagnosed with ovarian cancer in her 10s but patient is not aware of the results of BRCA testing which may be important for her to know. She does complain of having fatigue all the time, not feeling well and also applying for disability. She does take a multivitamin daily. She does not sleep well. She had severe arthritis in her left knee requiring a knee replacement in 2011. Her gastric bypass surgery was in 2000. She had a colonoscopy last year. There was a polyp removed in the next one will be scheduled in 5 years. She went to back specialists and they're planning to do MRI next month.  Patient is also been under a lot of stress because of her legal custody battle for her 43 year old granddaughter and patient finally have her possession now. On 12/15/2009 her hemoglobin was 9.8 g per crit 28.9 white count 8.6 and platelets were 175. The vitamin B12 level checked on 08/10/2013 was 1184, ferritin 07.8 TSH 6.75, folic acid more than 24, iron 40, transferrin 365, endomysial antibodies negative, tissue transglutaminase normal, zinc level LXXX normal copper 133 normal vitamin B1 normal, vitamin B6 was normal and vitamin D was low at 27, vitamin 8  level was normal and vitamin E. was also normal, ESR was 45.  Patient is taking Dexilant 4 GERD, Cymbalta for fibromyalgia, ibuprofen for musculoskeletal pain,  Vitamin D and naproxen. Her mother was diagnosed with his facial cancer and a sister has ovarian cancer. Patient quit smoking in October 2011. She does not consume enough fruits and vegetables.  INTERVAL HISTORY: She returns for follow up. She had iv Feraheme in Nov 2015, no change after infusion, still has mild to moderate fatigue, especially in later afternoon, but she functions well at home. She still has easy bruise, she has been taking VitC 1070m daily but has not noticed any change. She is on Aspirin for cardiac issues (LBBB). She had last colonoscopy which showed 1 polyp and was removed, next due in 4 years.   MEDICAL HISTORY:  Past Medical History  Diagnosis Date  . Fibromyalgia   . Kidney stone   . Childhood asthma   . Chicken pox   . GERD (gastroesophageal reflux disease)   . Anemia   . Allergy     SEASONAL  . Blood transfusion without reported diagnosis   . Ulcer     2004  . Tubular adenoma of colon   . Avascular necrosis     knee,left  . Nephrolithiasis   . Asthma   . Vitamin D deficiency   . Osteopenia   . Eczema     hands  . Easy bruising   . ESR raised   . History of anemia   . H/O gastric bypass   .  ESR raised   . Vitamin C deficiency   . Iron deficiency   . Avascular necrosis     LEFT KNEE  . Family history of ovarian cancer     SURGICAL HISTORY: Past Surgical History  Procedure Laterality Date  . Left knee replacement  01.2011  . Gastric bypass  2003  . Abdominal hysterectomy  2000    w/opph  . Appendectomy    . Cholecystectomy  2003  . Dental surgery      All Upper Removed  . Breast biopsy Right 1997  . Left heart catheterization with coronary angiogram N/A 11/15/2014    Procedure: LEFT HEART CATHETERIZATION WITH CORONARY ANGIOGRAM;  Surgeon: Troy Sine, MD;  Location: [facility] CATH  LAB;  Service: Cardiovascular;  Laterality: N/A;    SOCIAL HISTORY: History   Social History  . Marital Status: Married    Spouse Name: N/A    Number of Children: N/A  . Years of Education: N/A   Occupational History  . Not on file.   Social History Main Topics  . Smoking status: Former Smoker    Quit date: 02/15/2012  . Smokeless tobacco: Never Used     Comment: quit 09/2010  . Alcohol Use: No  . Drug Use: No  . Sexual Activity: Yes    Birth Control/ Protection: Surgical   Other Topics Concern  . 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 Patient is on disability trying to maintain it. She won a cutody battle for a 68 year old grand daughter.    FAMILY HISTORY: Family History  Problem Relation Age of Onset  . Asthma Mother     Deceased  . Cancer - Other Mother     Esophageal 42s; deceased 33; smoker  . Other Mother 37    Ruptured Bowel  . GER disease Mother   . Coronary artery disease Mother   . Rashes / Skin problems Mother   . Heart disease Mother   . Heart attack Mother   . Diabetes Father     Deceased  . Heart attack Father   . Coronary artery disease Father   . Heart disease Father   . Throat cancer Maternal Grandmother     Deceased 53s  . Cancer - Other Maternal Grandmother     throat; deceased 22s  . Ovarian cancer Maternal Aunt     Deceased late 3s  . Healthy Brother   . Arthritis/Rheumatoid Daughter   . Rashes / Skin problems Daughter     psoriasis  . Psoriasis Daughter   . Ovarian cancer Sister     41yo; maternal half-sister; "gene positive"  . Cancer - Other Paternal Grandmother     "female cancer"; deceased 65s  . Cancer - Other Maternal Aunt     "female cancer" 53s   GENETICS:  Pt had the OvaNext gene panel done. This test, which included sequencing and deletion/duplication analysis of 24 genes, was  performed at [facility]. Testing was did not reveal any clearly pathogenic mutation in these genes. The genes tested were ATM, BARD1, BRCA1, BRCA2, BRIP1, CDH1, CHEK2, EPCAM, MLH1, MRE11A, MSH2, MSH6, MUTYH, NBN, NF1, PALB2, PMS2, PTEN, RAD50, RAD51C, RAD51D, SMARCA4, STK11, and TP53.  DIETARY HISTORY:  ALLERGIES:  is allergic to sodium pentobarbital.  MEDICATIONS:  Current Outpatient Prescriptions  Medication Sig Dispense Refill  . Ascorbic Acid (VITAMIN C) 1000 MG tablet Take 1,000 mg by mouth daily.    Marland Kitchen aspirin EC 81 MG tablet Take 1 tablet (81 mg total) by mouth daily. 90 tablet 3  . Cyanocobalamin (VITAMIN B-12 IJ) Inject as directed every 30 (thirty) days.    . Multiple Vitamins-Minerals (MULTIVITAMIN WITH MINERALS) tablet Take 1 tablet by mouth daily.    . pantoprazole (PROTONIX) 40 MG tablet Take 40 mg by mouth daily.     No current facility-administered medications for this visit.    REVIEW OF SYSTEMS:   Constitutional: Denies fevers, chills or abnormal night sweats, +fatigue Eyes: Denies blurriness of vision, double vision or watery eyes Ears, nose, mouth, throat, and face: Denies mucositis or sore throat Respiratory: Denies cough, dyspnea or wheezes Cardiovascular: Denies palpitation, chest discomfort or lower extremity swelling Gastrointestinal:  Denies nausea, heartburn or change in bowel habits Skin: Denies abnormal skin rashes Lymphatics: Denies new lymphadenopathy but c/o + easy bruising Neurological:Denies numbness, tingling or new weaknesses Behavioral/Psych: Mood is stable, no new changes, anxious+ All other systems were reviewed with the patient and are negative.  PHYSICAL EXAMINATION: ECOG PERFORMANCE STATUS: 0-1  Filed Vitals:   01/09/15 0950  BP: 130/64  Pulse: 64  Temp: 98 F (36.7 C)  Resp: 18   Filed Weights   01/09/15 0950  Weight: 252 lb 1.6 oz (114.352 kg)    GENERAL:alert, no distress and comfortable SKIN: skin color, texture, turgor  are normal, no rashes or significant lesions EYES: normal, conjunctiva are pink and non-injected, sclera clear OROPHARYNX:no exudate, no erythema and lips, buccal mucosa, and tongue normal  NECK: supple, thyroid normal size, non-tender, without nodularity LYMPH:  no palpable lymphadenopathy in the cervical, axillary or inguinal LUNGS: clear to auscultation and percussion with normal breathing effort HEART: regular rate & rhythm and no murmurs and no lower extremity edema ABDOMEN:abdomen soft, non-tender and normal bowel sounds Musculoskeletal:no cyanosis of digits and no clubbing  PSYCH: alert & oriented x 3 with fluent speech NEURO: no focal motor/sensory deficits  LABORATORY DATA:  I have reviewed the data as listed Recent Results (from the past 2160 hour(s))  INR/PT     Status: None   Collection Time: 11/14/14  3:43 PM  Result Value Ref Range   INR 1.0 0.8 - 1.0 ratio   Prothrombin Time 11.1 9.6 - 13.1 sec  PTT     Status: None   Collection Time: 11/14/14  3:43 PM  Result Value Ref Range   aPTT 31.7 23.4 - 32.7 SEC  CBC w/Diff     Status: Abnormal   Collection Time: 11/14/14  3:43 PM  Result Value Ref Range   WBC 10.7 (H) 4.0 - 10.5 K/uL   RBC 4.59 3.87 - 5.11 Mil/uL   Hemoglobin 12.8 12.0 - 15.0 g/dL   HCT 39.2 36.0 - 46.0 %   MCV 85.4 78.0 - 100.0 fl   MCHC 32.7 30.0 - 36.0 g/dL   RDW 18.7 (H) 11.5 - 15.5 %   Platelets 229.0 150.0 - 400.0 K/uL   Neutrophils Relative % 64.4 43.0 - 77.0 %   Lymphocytes Relative 28.0 12.0 - 46.0 %   Monocytes Relative 4.6 3.0 - 12.0 %   Eosinophils Relative 1.8 0.0 - 5.0 %   Basophils Relative 1.2 0.0 - 3.0 %   Neutro Abs 6.9 1.4 - 7.7 K/uL   Lymphs Abs 3.0 0.7 - 4.0 K/uL   Monocytes Absolute 0.5 0.1 - 1.0  K/uL   Eosinophils Absolute 0.2 0.0 - 0.7 K/uL   Basophils Absolute 0.1 0.0 - 0.1 K/uL  Basic Metabolic Panel (BMET)     Status: Abnormal   Collection Time: 11/14/14  3:43 PM  Result Value Ref Range   Sodium 136 135 - 145 mEq/L    Potassium 4.0 3.5 - 5.1 mEq/L   Chloride 107 96 - 112 mEq/L   CO2 21 19 - 32 mEq/L   Glucose, Bld 108 (H) 70 - 99 mg/dL   BUN 19 6 - 23 mg/dL   Creatinine, Ser 0.7 0.4 - 1.2 mg/dL   Calcium 9.2 8.4 - 10.5 mg/dL   GFR 93.65 >60.00 mL/min  HgB A1c     Status: None   Collection Time: 11/19/14  7:48 AM  Result Value Ref Range   Hgb A1c MFr Bld 5.4 4.6 - 6.5 %    Comment: Glycemic Control Guidelines for People with Diabetes:Non Diabetic:  <6%Goal of Therapy: <7%Additional Action Suggested:  >8%   Lipid Profile     Status: None   Collection Time: 11/19/14  7:48 AM  Result Value Ref Range   Cholesterol 158 0 - 200 mg/dL    Comment: ATP III Classification       Desirable:  < 200 mg/dL               Borderline High:  200 - 239 mg/dL          High:  > = 240 mg/dL   Triglycerides 141.0 0.0 - 149.0 mg/dL    Comment: Normal:  <150 mg/dLBorderline High:  150 - 199 mg/dL   HDL 39.10 >39.00 mg/dL   VLDL 28.2 0.0 - 40.0 mg/dL   LDL Cholesterol 91 0 - 99 mg/dL   Total CHOL/HDL Ratio 4     Comment:                Men          Women1/2 Average Risk     3.4          3.3Average Risk          5.0          4.42X Average Risk          9.6          7.13X Average Risk          15.0          11.0                       NonHDL 118.90     Comment: NOTE:  Non-HDL goal should be 30 mg/dL higher than patient's LDL goal (i.e. LDL goal of < 70 mg/dL, would have non-HDL goal of < 100 mg/dL)  ALT     Status: None   Collection Time: 11/19/14  7:48 AM  Result Value Ref Range   ALT 32 0 - 35 U/L  CBC with Differential     Status: Abnormal   Collection Time: 01/09/15  8:50 AM  Result Value Ref Range   WBC 9.9 3.9 - 10.3 10e3/uL   NEUT# 6.8 (H) 1.5 - 6.5 10e3/uL   HGB 12.4 11.6 - 15.9 g/dL   HCT 38.1 34.8 - 46.6 %   Platelets 268 145 - 400 10e3/uL   MCV 89.2 79.5 - 101.0 fL   MCH 29.0 25.1 - 34.0 pg   MCHC 32.5 31.5 - 36.0 g/dL   RBC 4.27 3.70 -  5.45 10e6/uL   RDW 14.4 11.2 - 14.5 %   lymph# 2.5 0.9 - 3.3 10e3/uL    MONO# 0.4 0.1 - 0.9 10e3/uL   Eosinophils Absolute 0.2 0.0 - 0.5 10e3/uL   Basophils Absolute 0.0 0.0 - 0.1 10e3/uL   NEUT% 68.6 38.4 - 76.8 %   LYMPH% 25.1 14.0 - 49.7 %   MONO% 3.7 0.0 - 14.0 %   EOS% 2.3 0.0 - 7.0 %   BASO% 0.3 0.0 - 2.0 %  Ferritin     Status: None   Collection Time: 01/09/15  8:50 AM  Result Value Ref Range   Ferritin 39 9 - 269 ng/ml  Iron and TIBC [facility]     Status: Abnormal   Collection Time: 01/09/15  8:50 AM  Result Value Ref Range   Iron 37 (L) 41 - 142 ug/dL   TIBC 336 236 - 444 ug/dL   UIBC 299 120 - 384 ug/dL   %SAT 11 (L) 21 - 57 %  Sedimentation rate     Status: Abnormal (Preliminary result)   Collection Time: 01/09/15  8:50 AM  Result Value Ref Range   Sed Rate 47 (H) 0 - 22 mm/hr    ASSESSMENT: 54 years old female with following issues:  1. Anemia of iron deficinecy, resolved -Her hemoglobin is 12.8 today, ferritin 39, serum iron 37, TIBC 336, saturation 11%. She still has mild iron deficiency, I encouraged her to take oral iron pill.  2. Iron deficiency, likely secondary to gastric bypass surgery  3. Generalized Musculoskeletal symptoms and Fibromyalgia.  4. Easy bruising. -Her platelet counts is normal, no significant bruise on exam. No significant history of bleeding -Observation  5. Fatigue in setting of Gastric bypass surgery.  FOLLOW UP: -oral iron pill 1-2 tab daily -RTC in 3 month   Truitt Merle  01/09/2015

## 2015-01-09 NOTE — Telephone Encounter (Signed)
Pt confirmed labs/ov per 02/04 POF, gave pt AVS.... KJ °

## 2015-01-14 LAB — VITAMIN C: Vitamin C: 0.8 mg/dL (ref 0.2–1.5)

## 2015-01-14 LAB — SEDIMENTATION RATE: Sed Rate: 47 mm/hr — ABNORMAL HIGH (ref 0–22)

## 2015-04-09 ENCOUNTER — Other Ambulatory Visit: Payer: BLUE CROSS/BLUE SHIELD

## 2015-04-13 ENCOUNTER — Encounter: Payer: Self-pay | Admitting: Hematology

## 2015-04-15 ENCOUNTER — Ambulatory Visit: Payer: BLUE CROSS/BLUE SHIELD | Admitting: Hematology

## 2015-11-04 ENCOUNTER — Other Ambulatory Visit: Payer: Self-pay | Admitting: Family Medicine

## 2015-11-04 DIAGNOSIS — R911 Solitary pulmonary nodule: Secondary | ICD-10-CM

## 2015-11-21 ENCOUNTER — Other Ambulatory Visit: Payer: BLUE CROSS/BLUE SHIELD

## 2015-12-09 ENCOUNTER — Other Ambulatory Visit: Payer: BLUE CROSS/BLUE SHIELD

## 2015-12-12 ENCOUNTER — Other Ambulatory Visit: Payer: BLUE CROSS/BLUE SHIELD

## 2016-10-21 ENCOUNTER — Telehealth: Payer: Self-pay

## 2016-10-21 NOTE — Telephone Encounter (Signed)
NOTES SENT TO SCHEDULING 10/21/16 RJ

## 2016-11-10 NOTE — Progress Notes (Signed)
Cardiology Office Note   Date:  11/11/2016   ID:  Karla [name], DOB October 14, 1961, MRN [id number]  PCP:  Karla Frees, MD    No chief complaint on file. bradycardia   Wt Readings from Last 3 Encounters:  11/11/16 239 lb 6.4 oz (108.6 kg)  01/09/15 252 lb 1.6 oz (114.4 kg)  11/15/14 250 lb (113.4 kg)       History of Present Illness: Karla [name] is a 55 y.o. female  Who was noted to have bradycardia.  She is very active.  She exercises 4-7 days/weeek.  She swims and does water aerobics.  She does the treadmill.  She does not follow her HR.  No lightheadedness or syncope.    She has had asthma for years, which is only a problem with extreme exertion.  No BP meds.  Never on rate slowing drugs.   No CP , SHOB.  She can maintain her activity level quite well.   She had a cath in 2015 showing minimal plaque and calcification.     Past Medical History:  Diagnosis Date  . Allergy    SEASONAL  . Anemia   . Asthma   . Avascular necrosis ([place])    knee,left  . Avascular necrosis ([facility])    LEFT KNEE  . Blood transfusion without reported diagnosis   . Chicken pox   . Childhood asthma   . Easy bruising   . Eczema    hands  . ESR raised   . ESR raised   . Family history of ovarian cancer   . Fibromyalgia   . GERD (gastroesophageal reflux disease)   . H/O gastric bypass   . History of anemia   . Iron deficiency   . Kidney stone   . Nephrolithiasis   . Osteopenia   . Tubular adenoma of colon   . Ulcer ([place])    2004  . Vitamin C deficiency   . Vitamin D deficiency     Past Surgical History:  Procedure Laterality Date  . ABDOMINAL HYSTERECTOMY  2000   w/opph  . APPENDECTOMY    . BREAST BIOPSY Right 1997  . CHOLECYSTECTOMY  2003  . DENTAL SURGERY     All Upper Removed  . GASTRIC BYPASS  2003  . LEFT HEART CATHETERIZATION WITH CORONARY ANGIOGRAM N/A 11/15/2014   Procedure: LEFT HEART CATHETERIZATION WITH CORONARY ANGIOGRAM;  Surgeon:  Troy Sine, MD;  [facility] CATH LAB;  Service: Cardiovascular;  Laterality: N/A;  . left knee replacement  01.2011     Current Outpatient Prescriptions  Medication Sig Dispense Refill  . Ascorbic Acid (VITAMIN C) 1000 MG tablet Take 1,000 mg by mouth daily.    Marland Kitchen aspirin EC 81 MG tablet Take 1 tablet (81 mg total) by mouth daily. 90 tablet 3  . Cyanocobalamin (VITAMIN B-12 IJ) Inject as directed every 30 (thirty) days.    . fexofenadine-pseudoephedrine (ALLEGRA-D 24) 180-240 MG 24 hr tablet Take 1 tablet by mouth as needed (SEASONAL ALLERGIES).     . Multiple Vitamins-Minerals (MULTIVITAMIN WITH MINERALS) tablet Take 1 tablet by mouth daily.    . pantoprazole (PROTONIX) 40 MG tablet Take 40 mg by mouth daily.     No current facility-administered medications for this visit.     Allergies:   Sodium pentobarbital [pentobarbital]    Social History:  The patient  reports that she quit smoking about 4 years ago. She has never used smokeless tobacco. She reports that  she does not drink alcohol or use drugs.   Family History:  The patient's family history includes Arthritis/Rheumatoid in her daughter; Asthma in her mother; Cancer - Other in her maternal aunt, maternal grandmother, mother, and paternal grandmother; Coronary artery disease in her father and mother; Diabetes in her father; GER disease in her mother; Healthy in her brother; Heart attack in her father and mother; Heart disease in her father and mother; Other (age of onset: 73) in her mother; Ovarian cancer in her maternal aunt and sister; Psoriasis in her daughter; Rashes / Skin problems in her daughter and mother; Throat cancer in her maternal grandmother.    ROS:  Please see the history of present illness.   Otherwise, review of systems are positive for occasional cough and old.   All other systems are reviewed and negative.    PHYSICAL EXAM: VS:  BP 110/80   Pulse (!) 46   Ht 5' 5.5" (1.664 m)   Wt 239 lb 6.4 oz (108.6  kg)   BMI 39.23 kg/m  , BMI Body mass index is 39.23 kg/m. GEN: Well nourished, well developed, in no acute distress  HEENT: normal  Neck: no JVD, carotid bruits, or masses Cardiac: bradycardic; no murmurs, rubs, or gallops,no edema  Respiratory:  clear to auscultation bilaterally, normal work of breathing GI: soft, nontender, nondistended, + BS MS: no deformity or atrophy  Skin: warm and dry, no rash Neuro:  Strength and sensation are intact Psych: euthymic mood, full affect   EKG:   The ekg ordered today demonstrates sinus bradycardia, no significant ST segment changes   Recent Labs: No results found for requested labs within last 8760 hours.   Lipid Panel    Component Value Date/Time   CHOL 158 11/19/2014 0748   TRIG 141.0 11/19/2014 0748   HDL 39.10 11/19/2014 0748   CHOLHDL 4 11/19/2014 0748   VLDL 28.2 11/19/2014 0748   LDLCALC 91 11/19/2014 0748     Other studies Reviewed: Additional studies/ records that were reviewed today with results demonstrating: cath results as above.   ASSESSMENT AND PLAN:  1. Bradycardia: No symptoms. Continue regular exercise. No lightheadedness or syncope. No medications which would be causing this. LBBB not noted on most recent ECG. 2. Ischemic workup from 2015 was negative. No further testing required. 3. Follow-up as needed.   Current medicines are reviewed at length with the patient today.  The patient concerns regarding her medicines were addressed.  The following changes have been made:  No change  Labs/ tests ordered today include:  No orders of the defined types were placed in this encounter.   Recommend 150 minutes/week of aerobic exercise Low fat, low carb, high fiber diet recommended  Disposition:   FU prn   Signed, Larae Grooms, MD  11/11/2016 9:39 AM    [facility] [place], [place], [place]  [address] Phone: [phone]; Fax: [phone]

## 2016-11-11 ENCOUNTER — Ambulatory Visit (INDEPENDENT_AMBULATORY_CARE_PROVIDER_SITE_OTHER): Payer: Managed Care, Other (non HMO) | Admitting: Interventional Cardiology

## 2016-11-11 ENCOUNTER — Encounter: Payer: Self-pay | Admitting: Interventional Cardiology

## 2016-11-11 VITALS — BP 110/80 | HR 46 | Ht 65.5 in | Wt 239.4 lb

## 2016-11-11 DIAGNOSIS — R001 Bradycardia, unspecified: Secondary | ICD-10-CM

## 2016-11-11 DIAGNOSIS — I447 Left bundle-branch block, unspecified: Secondary | ICD-10-CM

## 2016-11-11 NOTE — Patient Instructions (Signed)
**Note De-identified Simra Fiebig Obfuscation** Medication Instructions:  Same-no changes  Labwork: None  Testing/Procedures: None  Follow-Up: As needed     If you need a refill on your cardiac medications before your next appointment, please call your pharmacy.   

## 2016-11-17 ENCOUNTER — Encounter: Payer: Self-pay | Admitting: Interventional Cardiology

## 2017-09-10 DIAGNOSIS — Z23 Encounter for immunization: Secondary | ICD-10-CM | POA: Diagnosis not present

## 2018-07-17 NOTE — Progress Notes (Signed)
An updated report was received today from [facility] indicating the the NBN VUS previously identified in 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 A copy of this new report will be sent to get scanned and mailed to her.   Steele Berg, MS, [name] Certified Genetic Counselor phone: [phone] [name]

## 2018-08-30 DIAGNOSIS — Z23 Encounter for immunization: Secondary | ICD-10-CM | POA: Diagnosis not present

## 2018-09-21 DIAGNOSIS — K219 Gastro-esophageal reflux disease without esophagitis: Secondary | ICD-10-CM | POA: Diagnosis not present

## 2018-09-21 DIAGNOSIS — E559 Vitamin D deficiency, unspecified: Secondary | ICD-10-CM | POA: Diagnosis not present

## 2018-09-21 DIAGNOSIS — J4520 Mild intermittent asthma, uncomplicated: Secondary | ICD-10-CM | POA: Diagnosis not present

## 2018-09-27 ENCOUNTER — Encounter: Payer: Self-pay | Admitting: Gastroenterology

## 2018-11-14 DIAGNOSIS — J209 Acute bronchitis, unspecified: Secondary | ICD-10-CM | POA: Diagnosis not present

## 2020-06-12 ENCOUNTER — Other Ambulatory Visit: Payer: Self-pay

## 2020-06-12 ENCOUNTER — Encounter: Payer: Self-pay | Admitting: Family Medicine

## 2020-06-12 ENCOUNTER — Ambulatory Visit: Payer: Self-pay

## 2020-06-12 ENCOUNTER — Ambulatory Visit (INDEPENDENT_AMBULATORY_CARE_PROVIDER_SITE_OTHER): Payer: Commercial Managed Care - PPO | Admitting: Family Medicine

## 2020-06-12 VITALS — BP 145/78 | HR 57 | Ht 65.0 in | Wt 250.0 lb

## 2020-06-12 DIAGNOSIS — M25461 Effusion, right knee: Secondary | ICD-10-CM | POA: Diagnosis not present

## 2020-06-12 DIAGNOSIS — M25561 Pain in right knee: Secondary | ICD-10-CM

## 2020-06-12 MED ORDER — PENNSAID 2 % EX SOLN: 1.0000 "application " | Freq: Two times a day (BID) | CUTANEOUS | 3 refills | Status: DC

## 2020-06-12 NOTE — Patient Instructions (Signed)
Good to see you Please try the exercises  Please try ice  Please try the brace  Please send me a message in MyChart with any questions or updates.  Please see me back in 4 weeks. Please follow up before your trip if your pain isn't improving.   --Dr. Raeford Razor

## 2020-06-12 NOTE — Assessment & Plan Note (Signed)
Mild degenerative changes appreciated of the joint space.  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 -Counseled on home exercise therapy and supportive care. -Brace. -Pennsaid. -Could consider injection imaging or physical therapy.

## 2020-06-12 NOTE — Progress Notes (Signed)
Karla [name] - 59 y.o. female MRN [id number]  Date of birth: Oct 08, 1961  SUBJECTIVE:  Including CC & ROS.  Chief Complaint  Patient presents with  . Knee Pain    right x 2 months    Karla [name] is a 59 y.o. female that is presenting with right knee pain.  The pain is been ongoing the past 2 months.  She does notice pain down the anterior aspect of the tibia but also posteriorly.  She has a left total knee arthroplasty in 2011.  She denies any specific inciting event and no previous surgery.  Has tried medications with some improvement of the pain.   Review of Systems See HPI   HISTORY: Past Medical, Surgical, Social, and Family History Reviewed & Updated per EMR.   Pertinent Historical Findings include:  Past Medical History:  Diagnosis Date  . Allergy    SEASONAL  . Anemia   . Asthma   . Avascular necrosis ([place])    knee,left  . Avascular necrosis ([facility])    LEFT KNEE  . Blood transfusion without reported diagnosis   . Chicken pox   . Childhood asthma   . Easy bruising   . Eczema    hands  . ESR raised   . ESR raised   . Family history of ovarian cancer   . Fibromyalgia   . GERD (gastroesophageal reflux disease)   . H/O gastric bypass   . History of anemia   . Iron deficiency   . Kidney stone   . Nephrolithiasis   . Osteopenia   . Tubular adenoma of colon   . Ulcer    2004  . Vitamin C deficiency   . Vitamin D deficiency     Past Surgical History:  Procedure Laterality Date  . ABDOMINAL HYSTERECTOMY  2000   w/opph  . APPENDECTOMY    . BREAST BIOPSY Right 1997  . CHOLECYSTECTOMY  2003  . DENTAL SURGERY     All Upper Removed  . GASTRIC BYPASS  2003  . LEFT HEART CATHETERIZATION WITH CORONARY ANGIOGRAM N/A 11/15/2014   Procedure: LEFT HEART CATHETERIZATION WITH CORONARY ANGIOGRAM;  Surgeon: Troy Sine, MD;  Location: [facility] C[facility];  Service: Cardiovascular;  Laterality: N/A;  . left knee replacement  01.2011    Family History    Problem Relation Age of Onset  . Asthma Mother        Deceased  . Cancer - Other Mother        Esophageal 61s; deceased 51; smoker  . Other Mother 7       Ruptured Bowel  . GER disease Mother   . Coronary artery disease Mother   . Rashes / Skin problems Mother   . Heart disease Mother   . Heart attack Mother   . Diabetes Father        Deceased  . Heart attack Father   . Coronary artery disease Father   . Heart disease Father   . Throat cancer Maternal Grandmother        Deceased 56s  . Cancer - Other Maternal Grandmother        throat; deceased 79s  . Ovarian cancer Maternal Aunt        Deceased late 76s  . Healthy Brother   . Arthritis/Rheumatoid Daughter   . Rashes / Skin problems Daughter        psoriasis  . Psoriasis Daughter   . Ovarian cancer Sister  84yo; maternal half-sister; "gene positive"  . Cancer - Other Paternal Grandmother        "female cancer"; deceased 57s  . Cancer - Other Maternal Aunt        "female cancer" 25s    Social History   Socioeconomic History  . Marital status: Married    Spouse name: Not on file  . Number of children: Not on file  . Years of education: Not on file  . Highest education level: Not on file  Occupational History  . Not on file  Tobacco Use  . Smoking status: Former Smoker    Quit date: 02/15/2012    Years since quitting: 8.3  . Smokeless tobacco: Never Used  . Tobacco comment: quit 09/2010  Substance and Sexual Activity  . Alcohol use: No  . Drug use: No  . Sexual activity: Yes    Birth control/protection: Surgical  Other Topics Concern  . 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 Patient is on disability trying to maintain it. She won a cutody battle for a 6 year old grand daughter.   Social Determinants of Health   Financial Resource Strain:   . Difficulty of Paying  Living Expenses:   Food Insecurity:   . Worried About Charity fundraiser in the Last Year:   . Arboriculturist in the Last Year:   Transportation Needs:   . Film/video editor (Medical):   Marland Kitchen Lack of Transportation (Non-Medical):   Physical Activity:   . Days of Exercise per Week:   . Minutes of Exercise per Session:   Stress:   . Feeling of Stress :   Social Connections:   . Frequency of Communication with Friends and Family:   . Frequency of Social Gatherings with Friends and Family:   . Attends Religious Services:   . Active Member of Clubs or Organizations:   . Attends Archivist Meetings:   Marland Kitchen Marital Status:   Intimate Partner Violence:   . Fear of Current or Ex-Partner:   . Emotionally Abused:   Marland Kitchen Physically Abused:   . Sexually Abused:      PHYSICAL EXAM:  VS: BP (!) 145/78   Pulse (!) 57   Ht 5' 5"  (1.651 m)   Wt 250 lb (113.4 kg)   BMI 41.60 kg/m  Physical Exam Gen: NAD, alert, cooperative with exam, well-appearing MSK:  Right knee: No obvious effusion. Normal range of motion. No instability valgus or varus stress testing. Normal strength resistance. Negative McMurray's test. Neurovascular intact  Limited ultrasound: Right knee:  Mild to moderate effusion in the suprapatellar pouch. Normal-appearing quadricep and patellar tendon. Mild medial joint space narrowing.   Normal-appearing lateral compartment  Summary: Effusion of the right knee  Ultrasound and interpretation by Clearance Coots, MD    ASSESSMENT & PLAN:   Effusion, right knee Mild degenerative changes appreciated of the joint space.  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 -Counseled on home exercise therapy and supportive care. -Brace. -Pennsaid. -Could consider injection imaging or physical therapy.

## 2020-07-10 ENCOUNTER — Ambulatory Visit: Payer: Commercial Managed Care - PPO | Admitting: Family Medicine

## 2021-09-30 DIAGNOSIS — K219 Gastro-esophageal reflux disease without esophagitis: Secondary | ICD-10-CM | POA: Diagnosis not present

## 2021-09-30 DIAGNOSIS — Z13220 Encounter for screening for lipoid disorders: Secondary | ICD-10-CM | POA: Diagnosis not present

## 2021-09-30 DIAGNOSIS — E559 Vitamin D deficiency, unspecified: Secondary | ICD-10-CM | POA: Diagnosis not present

## 2021-09-30 DIAGNOSIS — H6980 Other specified disorders of Eustachian tube, unspecified ear: Secondary | ICD-10-CM | POA: Diagnosis not present

## 2021-12-08 ENCOUNTER — Encounter: Payer: Self-pay | Admitting: Hematology

## 2021-12-09 ENCOUNTER — Ambulatory Visit
Admission: RE | Admit: 2021-12-09 | Discharge: 2021-12-09 | Disposition: A | Discharge status: Home / Self Care | Payer: BC Managed Care – PPO | Source: Ambulatory Visit | Attending: Emergency Medicine | Admitting: Emergency Medicine

## 2021-12-09 ENCOUNTER — Other Ambulatory Visit: Payer: Self-pay

## 2021-12-09 ENCOUNTER — Encounter: Payer: Self-pay | Admitting: Hematology

## 2021-12-09 VITALS — BP 138/70 | HR 55 | Temp 98.7°F | Resp 20

## 2021-12-09 DIAGNOSIS — J069 Acute upper respiratory infection, unspecified: Secondary | ICD-10-CM

## 2021-12-09 MED ORDER — FLUTICASONE PROPIONATE 50 MCG/ACT NA SUSP: 2.0000 | Freq: Every day | NASAL | 2 refills | Status: DC

## 2021-12-09 NOTE — ED Triage Notes (Signed)
Pt has cough, congestion , sore throat, and bilat ear pain since end of last week.

## 2021-12-09 NOTE — ED Provider Notes (Signed)
[facility]    CSN: [id number] Arrival date & time: 12/09/21  1740      History   Chief Complaint Chief Complaint  Patient presents with   Otalgia   Cough    HPI Karla [name] is a 61 y.o. female.  She reports 5 days of nasal congestion, cough, sore throat.  Denies fever or chills or body aches.  Reports she feels bilateral ear pressure from congestion.  Nasal drainage and sputum is clear.  Denies shortness of breath or wheezing.  Has tested self for COVID at home and it was negative.  Has been using DayQuil and NyQuil and Mucinex for her symptoms.  Inhaler but has not felt she needs to use it.  She is worried her illness will settle into her chest.     Otalgia Associated symptoms: congestion, cough, rhinorrhea and sore throat   Associated symptoms: no fever   Cough Associated symptoms: ear pain, rhinorrhea and sore throat   Associated symptoms: no chills, no fever, no shortness of breath and no wheezing    Past Medical History:  Diagnosis Date   Allergy    SEASONAL   Anemia    Asthma    Avascular necrosis ([facility])    knee,left   Avascular necrosis ([place])    LEFT KNEE   Blood transfusion without reported diagnosis    Chicken pox    Childhood asthma    Easy bruising    Eczema    hands   ESR raised    ESR raised    Family history of ovarian cancer    Fibromyalgia    GERD (gastroesophageal reflux disease)    H/O gastric bypass    History of anemia    Iron deficiency    Kidney stone    Nephrolithiasis    Osteopenia    Tubular adenoma of colon    Ulcer    2004   Vitamin C deficiency    Vitamin D deficiency     Patient Active Problem List   Diagnosis Date Noted   Effusion, right knee 06/12/2020   Genetic testing 12/24/2014   Family history of breast cancer    Family history of ovarian cancer    Coronary artery calcification seen on CAT scan 11/14/2014   SOB (shortness of breath) 11/14/2014   LBBB (left bundle branch block) 11/14/2014    Airway hyperreactivity 11/12/2014   Other disorders of bone development and growth, unspecified site 11/12/2014   Fibrositis 11/12/2014   Mild intermittent asthma without complication 81/44/8185   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   Chest pain 10/29/2014   Vitamin C deficiency 10/17/2014   Iron deficiency anemia 10/17/2014   FH: BRCA gene positive 10/17/2014   Easy bruising 09/16/2014   History of anemia 09/16/2014   ESR raised 09/16/2014   Other malaise and fatigue 09/11/2013   Severe obesity (BMI >= 40) ([place]) 09/11/2013   GERD (gastroesophageal reflux disease) 07/18/2013   H/O fibromyalgia 07/18/2013   Encounter for preventive health examination 07/18/2013   Cubital tunnel syndrome on right 07/18/2013   Acute bronchitis 07/18/2013   Routine general medical examination at a health care facility 12/13/2012    Past Surgical History:  Procedure Laterality Date   ABDOMINAL HYSTERECTOMY  2000   w/opph   APPENDECTOMY     BREAST BIOPSY Right 1997   CHOLECYSTECTOMY  2003   DENTAL SURGERY     All Upper Removed   GASTRIC BYPASS  2003   LEFT HEART CATHETERIZATION WITH  CORONARY ANGIOGRAM N/A 11/15/2014   Procedure: LEFT HEART CATHETERIZATION WITH CORONARY ANGIOGRAM;  Surgeon: Troy Sine, MD;  Location: [facility] CATH LAB;  Service: Cardiovascular;  Laterality: N/A;   left knee replacement  01.2011    OB History   No obstetric history on file.      Home Medications    Prior to Admission medications   Medication Sig Start Date End Date Taking? Authorizing Provider  fluticasone (FLONASE) 50 MCG/ACT nasal spray Place 2 sprays into both nostrils daily. 12/09/21  Yes Carvel Getting, NP  Ascorbic Acid (VITAMIN C) 1000 MG tablet Take 1,000 mg by mouth daily.    [provider]  aspirin EC 81 MG tablet Take 1 tablet (81 mg total) by mouth daily. 11/14/14   Sueanne Margarita, MD  Cyanocobalamin (VITAMIN B-12 IJ) Inject as directed every 30 (thirty) days.    [provider]   Diclofenac Sodium (PENNSAID) 2 % SOLN Place 1 application onto the skin 2 (two) times daily. 06/12/20   Rosemarie Ax, MD  fexofenadine-pseudoephedrine (ALLEGRA-D 24) 180-240 MG 24 hr tablet Take 1 tablet by mouth as needed (SEASONAL ALLERGIES).     [provider]  Multiple Vitamins-Minerals (MULTIVITAMIN WITH MINERALS) tablet Take 1 tablet by mouth daily.    [provider]  pantoprazole (PROTONIX) 40 MG tablet Take 40 mg by mouth daily.    [provider]    Family History Family History  Problem Relation Age of Onset   Asthma Mother        Deceased   Cancer - Other Mother        Esophageal 71s; deceased 106; smoker   Other Mother 42       Ruptured Bowel   GER disease Mother    Coronary artery disease Mother    Rashes / Skin problems Mother    Heart disease Mother    Heart attack Mother    Diabetes Father        Deceased   Heart attack Father    Coronary artery disease Father    Heart disease Father    Throat cancer Maternal Grandmother        Deceased 40s   Cancer - Other Maternal Grandmother        throat; deceased 17s   Ovarian cancer Maternal Aunt        Deceased late 40s   Healthy Brother    Arthritis/Rheumatoid Daughter    Rashes / Skin problems Daughter        psoriasis   Psoriasis Daughter    Ovarian cancer Sister        68yo; maternal half-sister; "gene positive"   Cancer - Other Paternal Grandmother        "female cancer"; deceased 83s   Cancer - Other Maternal Aunt        "female cancer" 59s    Social History Social History   Tobacco Use   Smoking status: Former    Types: Cigarettes    Quit date: 02/15/2012    Years since quitting: 9.8   Smokeless tobacco: Never   Tobacco comments:    quit 09/2010  Substance Use Topics   Alcohol use: No   Drug use: No     Allergies   Sodium pentobarbital [pentobarbital]   Review of Systems Review of Systems  Constitutional:  Negative for chills and fever.  HENT:  Positive  for congestion, ear pain, postnasal drip, rhinorrhea and sore throat. Negative for sinus pressure.  Respiratory:  Positive for cough. Negative for shortness of breath and wheezing.     Physical Exam Triage Vital Signs ED Triage Vitals  Enc Vitals Group     BP 12/09/21 0917 138/70     Pulse Rate 12/09/21 0917 (!) 55     Resp 12/09/21 0917 20     Temp 12/09/21 0917 98.7 F (37.1 C)     Temp Source 12/09/21 0917 Oral     SpO2 12/09/21 0917 97 %     Weight --      Height --      Head Circumference --      Peak Flow --      Pain Score 12/09/21 0915 6     Pain Loc --      Pain Edu? --      Excl. in [place]? --    No data found.  Updated Vital Signs BP 138/70 (BP Location: Left Arm)    Pulse (!) 55    Temp 98.7 F (37.1 C) (Oral)    Resp 20    SpO2 97%   Visual Acuity Right Eye Distance:   Left Eye Distance:   Bilateral Distance:    Right Eye Near:   Left Eye Near:    Bilateral Near:     Physical Exam Constitutional:      General: She is not in acute distress.    Appearance: Normal appearance. She is not ill-appearing.  HENT:     Right Ear: Tympanic membrane, ear canal and external ear normal.     Left Ear: Tympanic membrane, ear canal and external ear normal.     Nose: Congestion present.     Mouth/Throat:     Mouth: Mucous membranes are moist.     Pharynx: Oropharynx is clear.  Cardiovascular:     Rate and Rhythm: Normal rate and regular rhythm.  Pulmonary:     Effort: Pulmonary effort is normal.     Breath sounds: Normal breath sounds.  Neurological:     Mental Status: She is alert.     UC Treatments / Results  Labs (all labs ordered are listed, but only abnormal results are displayed) Labs Reviewed - No data to display  EKG   Radiology No results found.  Procedures Procedures (including critical care time)  Medications Ordered in UC Medications - No data to display  Initial Impression / Assessment and Plan / UC Course  I have reviewed the triage  vital signs and the nursing notes.  Pertinent labs & imaging results that were available during my care of the patient were reviewed by me and considered in my medical decision making (see chart for details).    Based on symptoms and exam, patient has an acute viral infection.  Reviewed supportive care measures including continuing DayQuil or NyQuil or Mucinex, and adding saline nasal spray.  Prescribed Flonase.  Encourage patient to use her inhaler if she does start having lungs/chest symptoms.  Explained that at this time, infection has not settled into her chest, she does not have bronchitis or pneumonia and discussed ways of managing acute URI to prevent it from settling into her chest.  Final Clinical Impressions(s) / UC Diagnoses   Final diagnoses:  Acute upper respiratory infection     Discharge Instructions      Continue using the DayQuil or NyQuil plus Mucinex.  Add saline nasal spray several times a day to try to help your congestion drain.  Anything you can do to help  your nasal congestion drain from your nose as opposed to draining down the back your throat or pushing into your ears is important to try.  Some people find steamy showers, Vicks VapoRub, and/or hot herbal tea with honey and lemon to be helpful.  Nasal saline spray several times a day can also help.  I have added a prescription nasal spray Flonase (however some insurance companies will not cover it as it is available over-the-counter as well).   ED Prescriptions     Medication Sig Dispense Auth. Provider   fluticasone (FLONASE) 50 MCG/ACT nasal spray Place 2 sprays into both nostrils daily. 16 g Carvel Getting, NP      PDMP not reviewed this encounter.   Carvel Getting, NP 12/09/21 1005

## 2021-12-09 NOTE — Discharge Instructions (Addendum)
Continue using the DayQuil or NyQuil plus Mucinex.  Add saline nasal spray several times a day to try to help your congestion drain.  Anything you can do to help your nasal congestion drain from your nose as opposed to draining down the back your throat or pushing into your ears is important to try.  Some people find steamy showers, Vicks VapoRub, and/or hot herbal tea with honey and lemon to be helpful.  Nasal saline spray several times a day can also help.  I have added a prescription nasal spray Flonase (however some insurance companies will not cover it as it is available over-the-counter as well).

## 2021-12-14 DIAGNOSIS — R059 Cough, unspecified: Secondary | ICD-10-CM | POA: Diagnosis not present

## 2022-01-25 ENCOUNTER — Other Ambulatory Visit: Payer: Self-pay

## 2022-01-25 ENCOUNTER — Ambulatory Visit (HOSPITAL_BASED_OUTPATIENT_CLINIC_OR_DEPARTMENT_OTHER)
Admission: RE | Admit: 2022-01-25 | Discharge: 2022-01-25 | Disposition: A | Discharge status: Home / Self Care | Payer: BC Managed Care – PPO | Source: Ambulatory Visit | Attending: Emergency Medicine | Admitting: Emergency Medicine

## 2022-01-25 ENCOUNTER — Ambulatory Visit
Admission: RE | Admit: 2022-01-25 | Discharge: 2022-01-25 | Disposition: A | Discharge status: Home / Self Care | Payer: BC Managed Care – PPO | Source: Ambulatory Visit | Attending: Emergency Medicine | Admitting: Emergency Medicine

## 2022-01-25 VITALS — BP 139/82 | HR 59 | Temp 97.7°F | Resp 18

## 2022-01-25 DIAGNOSIS — Q6689 Other  specified congenital deformities of feet: Secondary | ICD-10-CM

## 2022-01-25 DIAGNOSIS — M7732 Calcaneal spur, left foot: Secondary | ICD-10-CM | POA: Diagnosis not present

## 2022-01-25 DIAGNOSIS — M79672 Pain in left foot: Secondary | ICD-10-CM

## 2022-01-25 DIAGNOSIS — M19072 Primary osteoarthritis, left ankle and foot: Secondary | ICD-10-CM | POA: Diagnosis not present

## 2022-01-25 DIAGNOSIS — B351 Tinea unguium: Secondary | ICD-10-CM

## 2022-01-25 MED ORDER — DICLOFENAC SODIUM 1 % EX GEL: 4.0000 g | Freq: Four times a day (QID) | CUTANEOUS | 1 refills | Status: AC

## 2022-01-25 MED ORDER — TERBINAFINE HCL 250 MG PO TABS: 250.0000 mg | ORAL_TABLET | Freq: Every day | ORAL | 0 refills | Status: AC

## 2022-01-25 NOTE — Discharge Instructions (Addendum)
To have your foot x-ray done, please go to the [facility] at [facility]	[place] sometime today before 5 PM.  Please go to the main entrance to let them know you have an x-ray ordered, you will not need to be admitted to the facility as a patient.  The results of your x-ray take about 30 minutes to be complete, once we receive the result here at this location, we will contact you and let you know what it shows.  If there is a broken bone, or concern for a traumatic injury that requires the attention of an orthopedic specialist, I recommend that you go to one of the orthopedic urgent care clinics listed in your checkout sheet today.  I provided you with a prescription for Voltaren gel, it looks like you have been on Pennsaid solution for the same type of pain relief in the past.  I believe the Voltaren gel works a little bit better than the old solution does.  You can use this topical anti-inflammatory pain reliever 4 times daily as needed.  As we discussed, it is most likely that you have bruised the bone or inflamed one of the tendons in your foot.  This can take up to 6 weeks to heal so anything you can do to avoid bearing weight and causing pain will help promote healing sooner.  For your toenails in your left foot, please begin terbinafine, 1 tablet daily with your breakfast meal.  This medication works best to be take it with a meal that has a little bit of extra added fat in it such as butter or animal fat.  Continue taking daily for a full 90 days.  After you complete the full 90 days, it will take another month or 2 before healthy nail will begin to grow out but I believe that you will ultimately be pleased with result.  Thank you for coming back to visit [place] here at urgent care.  We appreciate the opportunity to participate in your care.

## 2022-01-25 NOTE — ED Triage Notes (Addendum)
Pt reports hitting left toes on bed last night and reports it might be broken.

## 2022-01-25 NOTE — Progress Notes (Signed)
There is no acute injury to her fourth metatarsal or phalanx in her left foot.  X-ray did redemonstrate a calcaneal heel spur which is not new, was seen on x-ray in 2010.  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  Recommend conservative care, topical anti-inflammatories, ice, rest and follow-up with PCP

## 2022-01-25 NOTE — ED Provider Notes (Signed)
[facility]    CSN: [id number] Arrival date & time: 01/25/22  2707    HISTORY   Chief Complaint  Patient presents with   Foot Pain   HPI Karla [name] is a 61 y.o. female. Patient reports having her left fourth toe on her bed about a week ago and again her coffee table yesterday in the same location, reports pain with weightbearing, unable to wear tennis shoes or her house slippers.  Patient denies previous injury to her left foot.  Patient states she is able to wiggle all of her toes and move her foot but not without pain.  Patient denies swelling of the foot.  Patient complains of deformity of left great and second toenails.  Patient states they have "always been that way".  The history is provided by the patient.  Past Medical History:  Diagnosis Date   Allergy    SEASONAL   Anemia    Asthma    Avascular necrosis ([place])    knee,left   Avascular necrosis ([place])    LEFT KNEE   Blood transfusion without reported diagnosis    Chicken pox    Childhood asthma    Easy bruising    Eczema    hands   ESR raised    ESR raised    Family history of ovarian cancer    Fibromyalgia    GERD (gastroesophageal reflux disease)    H/O gastric bypass    History of anemia    Iron deficiency    Kidney stone    Nephrolithiasis    Osteopenia    Tubular adenoma of colon    Ulcer    2004   Vitamin C deficiency    Vitamin D deficiency    Patient Active Problem List   Diagnosis Date Noted   Effusion, right knee 06/12/2020   Genetic testing 12/24/2014   Family history of breast cancer    Family history of ovarian cancer    Coronary artery calcification seen on CAT scan 11/14/2014   SOB (shortness of breath) 11/14/2014   LBBB (left bundle branch block) 11/14/2014   Airway hyperreactivity 11/12/2014   Other disorders of bone development and growth, unspecified site 11/12/2014   Fibrositis 11/12/2014   Mild intermittent asthma without complication 86/75/4492   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   Chest pain 10/29/2014   Vitamin C deficiency 10/17/2014   Iron deficiency anemia 10/17/2014   FH: BRCA gene positive 10/17/2014   Easy bruising 09/16/2014   History of anemia 09/16/2014   ESR raised 09/16/2014   Other malaise and fatigue 09/11/2013   Severe obesity (BMI >= 40) ([place]) 09/11/2013   GERD (gastroesophageal reflux disease) 07/18/2013   H/O fibromyalgia 07/18/2013   Encounter for preventive health examination 07/18/2013   Cubital tunnel syndrome on right 07/18/2013   Acute bronchitis 07/18/2013   Routine general medical examination at a health care facility 12/13/2012   Past Surgical History:  Procedure Laterality Date   ABDOMINAL HYSTERECTOMY  2000   w/opph   APPENDECTOMY     BREAST BIOPSY Right 1997   CHOLECYSTECTOMY  2003   DENTAL SURGERY     All Upper Removed   GASTRIC BYPASS  2003   LEFT HEART CATHETERIZATION WITH CORONARY ANGIOGRAM N/A 11/15/2014   Procedure: LEFT HEART CATHETERIZATION WITH CORONARY ANGIOGRAM;  Surgeon: Troy Sine, MD;  Location: [facility] CATH LAB;  Service: Cardiovascular;  Laterality: N/A;   left knee replacement  01.2011   OB History  No obstetric history on file.    Home Medications    Prior to Admission medications   Medication Sig Start Date End Date Taking? Authorizing Provider  Ascorbic Acid (VITAMIN C) 1000 MG tablet Take 1,000 mg by mouth daily.    [provider]  aspirin EC 81 MG tablet Take 1 tablet (81 mg total) by mouth daily. 11/14/14   Sueanne Margarita, MD  Cyanocobalamin (VITAMIN B-12 IJ) Inject as directed every 30 (thirty) days.    [provider]  Diclofenac Sodium (PENNSAID) 2 % SOLN Place 1 application onto the skin 2 (two) times daily. 06/12/20   Rosemarie Ax, MD  fexofenadine-pseudoephedrine (ALLEGRA-D 24) 180-240 MG 24 hr tablet Take 1 tablet by mouth as needed (SEASONAL ALLERGIES).     [provider]  fluticasone (FLONASE) 50 MCG/ACT nasal spray Place 2  sprays into both nostrils daily. 12/09/21   Carvel Getting, NP  Multiple Vitamins-Minerals (MULTIVITAMIN WITH MINERALS) tablet Take 1 tablet by mouth daily.    [provider]  pantoprazole (PROTONIX) 40 MG tablet Take 40 mg by mouth daily.    [provider]    Family History Family History  Problem Relation Age of Onset   Asthma Mother        Deceased   Cancer - Other Mother        Esophageal 58s; deceased 59; smoker   Other Mother 12       Ruptured Bowel   GER disease Mother    Coronary artery disease Mother    Rashes / Skin problems Mother    Heart disease Mother    Heart attack Mother    Diabetes Father        Deceased   Heart attack Father    Coronary artery disease Father    Heart disease Father    Throat cancer Maternal Grandmother        Deceased 54s   Cancer - Other Maternal Grandmother        throat; deceased 21s   Ovarian cancer Maternal Aunt        Deceased late 24s   Healthy Brother    Arthritis/Rheumatoid Daughter    Rashes / Skin problems Daughter        psoriasis   Psoriasis Daughter    Ovarian cancer Sister        28yo; maternal half-sister; "gene positive"   Cancer - Other Paternal Grandmother        "female cancer"; deceased 51s   Cancer - Other Maternal Aunt        "female cancer" 5s   Social History Social History   Tobacco Use   Smoking status: Former    Types: Cigarettes    Quit date: 02/15/2012    Years since quitting: 9.9   Smokeless tobacco: Never   Tobacco comments:    quit 09/2010  Substance Use Topics   Alcohol use: No   Drug use: No   Allergies   Sodium pentobarbital [pentobarbital]  Review of Systems Review of Systems Pertinent findings noted in history of present illness.   Physical Exam Triage Vital Signs ED Triage Vitals  Enc Vitals Group     BP 10/02/21 0827 (!) 147/82     Pulse Rate 10/02/21 0827 72     Resp 10/02/21 0827 18     Temp 10/02/21 0827 98.3 F (36.8 C)     Temp Source 10/02/21  0827 Oral     SpO2 10/02/21 0827 98 %  Weight --      Height --      Head Circumference --      Peak Flow --      Pain Score 10/02/21 0826 5     Pain Loc --      Pain Edu? --      Excl. in [place]? --   No data found.  Updated Vital Signs BP 139/82 (BP Location: Left Arm)    Pulse (!) 59    Temp 97.7 F (36.5 C) (Oral)    Resp 18    SpO2 98%   Physical Exam Vitals and nursing note reviewed.  Constitutional:      General: She is not in acute distress.    Appearance: Normal appearance. She is not ill-appearing.  HENT:     Head: Normocephalic and atraumatic.  Eyes:     General: Lids are normal.        Right eye: No discharge.        Left eye: No discharge.     Extraocular Movements: Extraocular movements intact.     Conjunctiva/sclera: Conjunctivae normal.     Right eye: Right conjunctiva is not injected.     Left eye: Left conjunctiva is not injected.  Neck:     Trachea: Trachea and phonation normal.  Cardiovascular:     Rate and Rhythm: Normal rate and regular rhythm.     Pulses: Normal pulses.     Heart sounds: Normal heart sounds. No murmur heard.   No friction rub. No gallop.  Pulmonary:     Effort: Pulmonary effort is normal. No accessory muscle usage, prolonged expiration or respiratory distress.     Breath sounds: Normal breath sounds. No stridor, decreased air movement or transmitted upper airway sounds. No decreased breath sounds, wheezing, rhonchi or rales.  Chest:     Chest wall: No tenderness.  Musculoskeletal:        General: Tenderness (At base of left fourth toe) and signs of injury (Ecchymoses at base of left fourth toe) present. No swelling or deformity. Normal range of motion.     Cervical back: Normal range of motion and neck supple. Normal range of motion.     Right lower leg: No edema.     Left lower leg: No edema.  Lymphadenopathy:     Cervical: No cervical adenopathy.  Skin:    General: Skin is warm and dry.     Findings: No erythema or rash.      Comments: Left great and second toenails thickened, deformed, concerning for fungal infection and nailbed.  Neurological:     General: No focal deficit present.     Mental Status: She is alert and oriented to person, place, and time.  Psychiatric:        Mood and Affect: Mood normal.        Behavior: Behavior normal.    Visual Acuity Right Eye Distance:   Left Eye Distance:   Bilateral Distance:    Right Eye Near:   Left Eye Near:    Bilateral Near:     UC Couse / Diagnostics / Procedures:    EKG  Radiology No results found.  Procedures Procedures (including critical care time)  UC Diagnoses / Final Clinical Impressions(s)   I have reviewed the triage vital signs and the nursing notes.  Pertinent labs & imaging results that were available during my care of the patient were reviewed by me and considered in my medical decision making (see chart for  details).    Final diagnoses:  Left foot pain  Onychomycosis   We will obtain x-ray of left foot, refer to orthopedic urgent care as needed.  Patient politely declined offer for a postop boot, states she will continue to wear her from flip-flops at this time.  Patient advised to avoid bearing weight which is possible.  Patient given prescription for Voltaren gel and terbinafine for malformed toenails.  Return precautions advised.  ED Prescriptions     Medication Sig Dispense Auth. Provider   diclofenac Sodium (VOLTAREN) 1 % GEL Apply 4 g topically 4 (four) times daily. 350 g Lynden Oxford Scales, PA-C   terbinafine (LAMISIL) 250 MG tablet Take 1 tablet (250 mg total) by mouth daily. 90 tablet Lynden Oxford Scales, [place]      PDMP not reviewed this encounter.  Pending results:  Labs Reviewed - No data to display  Medications Ordered in UC: Medications - No data to display  Disposition Upon Discharge:  Condition: stable for discharge home Home: take medications as prescribed; routine discharge instructions as  discussed; follow up as advised.  Patient presented with an acute illness with associated systemic symptoms and significant discomfort requiring urgent management. In my opinion, this is a condition that a prudent lay person (someone who possesses an average knowledge of health and medicine) may potentially expect to result in complications if not addressed urgently such as respiratory distress, impairment of bodily function or dysfunction of bodily organs.   Routine symptom specific, illness specific and/or disease specific instructions were discussed with the patient and/or caregiver at length.   As such, the patient has been evaluated and assessed, work-up was performed and treatment was provided in alignment with urgent care protocols and evidence based medicine.  Patient/parent/caregiver has been advised that the patient may require follow up for further testing and treatment if the symptoms continue in spite of treatment, as clinically indicated and appropriate.  If the patient was tested for COVID-19, Influenza and/or RSV, then the patient/parent/guardian was advised to isolate at home pending the results of his/her diagnostic coronavirus test and potentially longer if theyre positive. I have also advised pt that if his/her COVID-19 test returns positive, it's recommended to self-isolate for at least 10 days after symptoms first appeared AND until fever-free for 24 hours without fever reducer AND other symptoms have improved or resolved. Discussed self-isolation recommendations as well as instructions for household member/close contacts as per the [facility] and [place] DHHS, and also gave patient the [place] packet with this information.  Patient/parent/caregiver has been advised to return to the [facility] or PCP in 3-5 days if no better; to PCP or the Emergency Department if new signs and symptoms develop, or if the current signs or symptoms continue to change or worsen for further workup, evaluation and treatment  as clinically indicated and appropriate  The patient will follow up with their current PCP if and as advised. If the patient does not currently have a PCP we will assist them in obtaining one.   The patient may need specialty follow up if the symptoms continue, in spite of conservative treatment and management, for further workup, evaluation, consultation and treatment as clinically indicated and appropriate.   Patient/parent/caregiver verbalized understanding and agreement of plan as discussed.  All questions were addressed during visit.  Please see discharge instructions below for further details of plan.  Discharge Instructions:   Discharge Instructions      To have your foot x-ray done, please go to the [facility] [place]  [place] [facility]	[place] sometime today before 5 PM.  Please go to the main entrance to let them know you have an x-ray ordered, you will not need to be admitted to the facility as a patient.  The results of your x-ray take about 30 minutes to be complete, once we receive the result here at this location, we will contact you and let you know what it shows.  If there is a broken bone, or concern for a traumatic injury that requires the attention of an orthopedic specialist, I recommend that you go to one of the orthopedic urgent care clinics listed in your checkout sheet today.  I provided you with a prescription for Voltaren gel, it looks like you have been on Pennsaid solution for the same type of pain relief in the past.  I believe the Voltaren gel works a little bit better than the old solution does.  You can use this topical anti-inflammatory pain reliever 4 times daily as needed.  As we discussed, it is most likely that you have bruised the bone or inflamed one of the tendons in your foot.  This can take up to 6 weeks to heal so anything you can do to avoid bearing weight and causing pain will help promote healing sooner.  For your toenails in your left foot,  please begin terbinafine, 1 tablet daily with your breakfast meal.  This medication works best to be take it with a meal that has a little bit of extra added fat in it such as butter or animal fat.  Continue taking daily for a full 90 days.  After you complete the full 90 days, it will take another month or 2 before healthy nail will begin to grow out but I believe that you will ultimately be pleased with result.  Thank you for coming back to visit [place] here at urgent care.  We appreciate the opportunity to participate in your care.    This office note has been dictated using Museum/gallery curator.  Unfortunately, and despite my best efforts, this method of dictation can sometimes lead to occasional typographical or grammatical errors.  I apologize in advance if this occurs.     Lynden Oxford Scales, [place] 01/25/22 [phone]

## 2022-01-26 DIAGNOSIS — S9032XA Contusion of left foot, initial encounter: Secondary | ICD-10-CM | POA: Diagnosis not present

## 2022-04-29 ENCOUNTER — Ambulatory Visit
Admission: RE | Admit: 2022-04-29 | Discharge: 2022-04-29 | Disposition: A | Discharge status: Home / Self Care | Payer: BC Managed Care – PPO | Source: Ambulatory Visit | Attending: Emergency Medicine | Admitting: Emergency Medicine

## 2022-04-29 VITALS — HR 54 | Temp 98.9°F | Resp 24

## 2022-04-29 DIAGNOSIS — J209 Acute bronchitis, unspecified: Secondary | ICD-10-CM

## 2022-04-29 MED ORDER — METHYLPREDNISOLONE 4 MG PO TBPK: ORAL_TABLET | ORAL | 0 refills | Status: DC

## 2022-04-29 MED ORDER — AMOXICILLIN-POT CLAVULANATE 875-125 MG PO TABS: 1.0000 | ORAL_TABLET | Freq: Two times a day (BID) | ORAL | 0 refills | Status: AC

## 2022-04-29 MED ORDER — ALBUTEROL SULFATE HFA 108 (90 BASE) MCG/ACT IN AERS: 2.0000 | INHALATION_SPRAY | Freq: Four times a day (QID) | RESPIRATORY_TRACT | 0 refills | Status: DC | PRN

## 2022-04-29 MED ORDER — PROMETHAZINE-DM 6.25-15 MG/5ML PO SYRP: 5.0000 mL | ORAL_SOLUTION | Freq: Four times a day (QID) | ORAL | 0 refills | Status: DC | PRN

## 2022-04-29 NOTE — ED Triage Notes (Signed)
Pt is here with productive cough, congestion and general unwell feeling x 1 week.

## 2022-04-29 NOTE — ED Provider Notes (Signed)
[facility]    CSN: [id number] Arrival date & time: 04/29/22  9211    HISTORY   Chief Complaint  Patient presents with   Cough   Nasal Congestion   HPI Karla [name] is a 61 y.o. female. Patient presents urgent care today with a productive cough that is persistent and worse at night, states she has been coughing up material that looks like cottage cheese as well as yellow sputum.  Patient states she is also had congestion and has been feeling "crappy" for the past week.  Patient states her husband tested positive for strep throat but she is not experiencing any sore throat at this time.  The history is provided by the patient.  Past Medical History:  Diagnosis Date   Allergy    SEASONAL   Anemia    Asthma    Avascular necrosis ([place])    knee,left   Avascular necrosis ([place])    LEFT KNEE   Blood transfusion without reported diagnosis    Chicken pox    Childhood asthma    Easy bruising    Eczema    hands   ESR raised    ESR raised    Family history of ovarian cancer    Fibromyalgia    GERD (gastroesophageal reflux disease)    H/O gastric bypass    History of anemia    Iron deficiency    Kidney stone    Nephrolithiasis    Osteopenia    Tubular adenoma of colon    Ulcer    2004   Vitamin C deficiency    Vitamin D deficiency    Patient Active Problem List   Diagnosis Date Noted   Effusion, right knee 06/12/2020   Genetic testing 12/24/2014   Family history of breast cancer    Family history of ovarian cancer    Coronary artery calcification seen on CAT scan 11/14/2014   SOB (shortness of breath) 11/14/2014   LBBB (left bundle branch block) 11/14/2014   Airway hyperreactivity 11/12/2014   Other disorders of bone development and growth, unspecified site 11/12/2014   Fibrositis 11/12/2014   Mild intermittent asthma without complication 94/17/4081   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   Chest pain 10/29/2014   Vitamin C deficiency 10/17/2014    Iron deficiency anemia 10/17/2014   FH: BRCA gene positive 10/17/2014   Easy bruising 09/16/2014   History of anemia 09/16/2014   ESR raised 09/16/2014   Other malaise and fatigue 09/11/2013   Severe obesity (BMI >= 40) ([place]) 09/11/2013   GERD (gastroesophageal reflux disease) 07/18/2013   H/O fibromyalgia 07/18/2013   Encounter for preventive health examination 07/18/2013   Cubital tunnel syndrome on right 07/18/2013   Acute bronchitis 07/18/2013   Routine general medical examination at a health care facility 12/13/2012   Past Surgical History:  Procedure Laterality Date   ABDOMINAL HYSTERECTOMY  2000   w/opph   APPENDECTOMY     BREAST BIOPSY Right 1997   CHOLECYSTECTOMY  2003   DENTAL SURGERY     All Upper Removed   GASTRIC BYPASS  2003   LEFT HEART CATHETERIZATION WITH CORONARY ANGIOGRAM N/A 11/15/2014   Procedure: LEFT HEART CATHETERIZATION WITH CORONARY ANGIOGRAM;  Surgeon: Troy Sine, MD;  Location: [facility] CATH LAB;  Service: Cardiovascular;  Laterality: N/A;   left knee replacement  01.2011   OB History   No obstetric history on file.    Home Medications    Prior to Admission medications  Medication Sig Start Date End Date Taking? Authorizing Provider  albuterol (VENTOLIN HFA) 108 (90 Base) MCG/ACT inhaler Inhale 2 puffs into the lungs every 6 (six) hours as needed for wheezing or shortness of breath (Cough). 04/29/22  Yes Lynden Oxford Scales, PA-C  amoxicillin-clavulanate (AUGMENTIN) 875-125 MG tablet Take 1 tablet by mouth every 12 (twelve) hours for 7 days. 04/29/22 05/06/22 Yes Lynden Oxford Scales, PA-C  methylPREDNISolone (MEDROL DOSEPAK) 4 MG TBPK tablet Take 24 mg on day 1, 20 mg on day 2, 16 mg on day 3, 12 mg on day 4, 8 mg on day 5, 4 mg on day 6. 04/29/22  Yes Lynden Oxford Scales, PA-C  promethazine-dextromethorphan (PROMETHAZINE-DM) 6.25-15 MG/5ML syrup Take 5 mLs by mouth 4 (four) times daily as needed for cough. 04/29/22  Yes Lynden Oxford Scales,  PA-C  Ascorbic Acid (VITAMIN C) 1000 MG tablet Take 1,000 mg by mouth daily.    [provider]  aspirin EC 81 MG tablet Take 1 tablet (81 mg total) by mouth daily. 11/14/14   Sueanne Margarita, MD  Cyanocobalamin (VITAMIN B-12 IJ) Inject as directed every 30 (thirty) days.    [provider]  fexofenadine-pseudoephedrine (ALLEGRA-D 24) 180-240 MG 24 hr tablet Take 1 tablet by mouth as needed (SEASONAL ALLERGIES).     [provider]  fluticasone (FLONASE) 50 MCG/ACT nasal spray Place 2 sprays into both nostrils daily. 12/09/21   Carvel Getting, NP  Multiple Vitamins-Minerals (MULTIVITAMIN WITH MINERALS) tablet Take 1 tablet by mouth daily.    [provider]  pantoprazole (PROTONIX) 40 MG tablet Take 40 mg by mouth daily.    [provider]   Family History Family History  Problem Relation Age of Onset   Asthma Mother        Deceased   Cancer - Other Mother        Esophageal 73s; deceased 66; smoker   Other Mother 39       Ruptured Bowel   GER disease Mother    Coronary artery disease Mother    Rashes / Skin problems Mother    Heart disease Mother    Heart attack Mother    Diabetes Father        Deceased   Heart attack Father    Coronary artery disease Father    Heart disease Father    Throat cancer Maternal Grandmother        Deceased 79s   Cancer - Other Maternal Grandmother        throat; deceased 51s   Ovarian cancer Maternal Aunt        Deceased late 47s   Healthy Brother    Arthritis/Rheumatoid Daughter    Rashes / Skin problems Daughter        psoriasis   Psoriasis Daughter    Ovarian cancer Sister        62yo; maternal half-sister; "gene positive"   Cancer - Other Paternal Grandmother        "female cancer"; deceased 20s   Cancer - Other Maternal Aunt        "female cancer" 75s   Social History Social History   Tobacco Use   Smoking status: Former    Types: Cigarettes    Quit date: 02/15/2012    Years since  quitting: 10.2   Smokeless tobacco: Never   Tobacco comments:    quit 09/2010  Substance Use Topics   Alcohol use: No   Drug use: No   Allergies  Sodium pentobarbital [pentobarbital]  Review of Systems Review of Systems Pertinent findings noted in history of present illness.   Physical Exam Triage Vital Signs ED Triage Vitals  Enc Vitals Group     BP 10/02/21 0827 (!) 147/82     Pulse Rate 10/02/21 0827 72     Resp 10/02/21 0827 18     Temp 10/02/21 0827 98.3 F (36.8 C)     Temp Source 10/02/21 0827 Oral     SpO2 10/02/21 0827 98 %     Weight --      Height --      Head Circumference --      Peak Flow --      Pain Score 10/02/21 0826 5     Pain Loc --      Pain Edu? --      Excl. in [place]? --   No data found.  Updated Vital Signs Pulse (!) 54   Temp 98.9 F (37.2 C)   Resp (!) 24   SpO2 98%   Physical Exam Vitals and nursing note reviewed.  Constitutional:      General: She is not in acute distress.    Appearance: Normal appearance. She is not ill-appearing.  HENT:     Head: Normocephalic and atraumatic.     Salivary Glands: Right salivary gland is not diffusely enlarged or tender. Left salivary gland is not diffusely enlarged or tender.     Right Ear: Ear canal and external ear normal. No drainage. A middle ear effusion is present. There is no impacted cerumen. Tympanic membrane is bulging. Tympanic membrane is not injected or erythematous.     Left Ear: Ear canal and external ear normal. No drainage. A middle ear effusion is present. There is no impacted cerumen. Tympanic membrane is bulging. Tympanic membrane is not injected or erythematous.     Ears:     Comments: Bilateral EACs normal, both TMs bulging with clear fluid    Nose: Rhinorrhea present. No nasal deformity, septal deviation, signs of injury, nasal tenderness, mucosal edema or congestion. Rhinorrhea is clear.     Right Nostril: Occlusion present. No foreign body, epistaxis or septal hematoma.      Left Nostril: Occlusion present. No foreign body, epistaxis or septal hematoma.     Right Turbinates: Enlarged, swollen and pale.     Left Turbinates: Enlarged, swollen and pale.     Right Sinus: No maxillary sinus tenderness or frontal sinus tenderness.     Left Sinus: No maxillary sinus tenderness or frontal sinus tenderness.     Mouth/Throat:     Lips: Pink. No lesions.     Mouth: Mucous membranes are moist. No oral lesions.     Pharynx: Oropharynx is clear. Uvula midline. No posterior oropharyngeal erythema or uvula swelling.     Tonsils: No tonsillar exudate. 0 on the right. 0 on the left.     Comments: Postnasal drip Eyes:     General: Lids are normal.        Right eye: No discharge.        Left eye: No discharge.     Extraocular Movements: Extraocular movements intact.     Conjunctiva/sclera: Conjunctivae normal.     Right eye: Right conjunctiva is not injected.     Left eye: Left conjunctiva is not injected.  Neck:     Trachea: Trachea and phonation normal.  Cardiovascular:     Rate and Rhythm: Regular rhythm. Bradycardia present. No extrasystoles are present.  Pulses: Normal pulses.     Heart sounds: Normal heart sounds, S1 normal and S2 normal. Heart sounds not distant. No murmur heard.   No friction rub. No gallop.  Pulmonary:     Effort: Pulmonary effort is normal. No accessory muscle usage, prolonged expiration or respiratory distress.     Breath sounds: No stridor, decreased air movement or transmitted upper airway sounds. Examination of the right-upper field reveals rhonchi and rales. Examination of the left-upper field reveals rhonchi and rales. Examination of the right-middle field reveals rhonchi. Examination of the left-middle field reveals rhonchi. Rhonchi and rales present. No decreased breath sounds or wheezing.     Comments: Deep bronchial cough with bronchospasm Chest:     Chest wall: No tenderness.  Musculoskeletal:        General: Normal range of motion.      Cervical back: Normal range of motion and neck supple. Normal range of motion.     Right lower leg: No edema.     Left lower leg: No edema.  Lymphadenopathy:     Cervical: No cervical adenopathy.  Skin:    General: Skin is warm and dry.     Findings: No erythema or rash.  Neurological:     General: No focal deficit present.     Mental Status: She is alert and oriented to person, place, and time.  Psychiatric:        Mood and Affect: Mood normal.        Behavior: Behavior normal.    Visual Acuity Right Eye Distance:   Left Eye Distance:   Bilateral Distance:    Right Eye Near:   Left Eye Near:    Bilateral Near:     UC Couse / Diagnostics / Procedures:    EKG  Radiology No results found.  Procedures Procedures (including critical care time)  UC Diagnoses / Final Clinical Impressions(s)   I have reviewed the triage vital signs and the nursing notes.  Pertinent labs & imaging results that were available during my care of the patient were reviewed by me and considered in my medical decision making (see chart for details).   Final diagnoses:  Acute bronchitis, unspecified organism   Due to patient's symptoms being persistent over the past week and cough productive of small amounts of yellow sputum that has been progressing to green, recommend patient begin amoxicillin in addition to methylprednisolone concerning for acute bacterial bronchitis.  Patient also provided with an albuterol inhaler and Promethazine DM for cough at night.  Patient advised to continue using Flonase, Allegra and Robitussin as well.  Return precautions advised.  ED Prescriptions     Medication Sig Dispense Auth. Provider   amoxicillin-clavulanate (AUGMENTIN) 875-125 MG tablet Take 1 tablet by mouth every 12 (twelve) hours for 7 days. 14 tablet Lynden Oxford Scales, PA-C   albuterol (VENTOLIN HFA) 108 (90 Base) MCG/ACT inhaler Inhale 2 puffs into the lungs every 6 (six) hours as needed for  wheezing or shortness of breath (Cough). 18 g Lynden Oxford Scales, PA-C   methylPREDNISolone (MEDROL DOSEPAK) 4 MG TBPK tablet Take 24 mg on day 1, 20 mg on day 2, 16 mg on day 3, 12 mg on day 4, 8 mg on day 5, 4 mg on day 6. 21 tablet Lynden Oxford Scales, PA-C   promethazine-dextromethorphan (PROMETHAZINE-DM) 6.25-15 MG/5ML syrup Take 5 mLs by mouth 4 (four) times daily as needed for cough. 118 mL Lynden Oxford Scales, PA-C      PDMP not  reviewed this encounter.  Pending results:  Labs Reviewed - No data to display  Medications Ordered in UC: Medications - No data to display  Disposition Upon Discharge:  Condition: stable for discharge home Home: take medications as prescribed; routine discharge instructions as discussed; follow up as advised.  Patient presented with an acute illness with associated systemic symptoms and significant discomfort requiring urgent management. In my opinion, this is a condition that a prudent lay person (someone who possesses an average knowledge of health and medicine) may potentially expect to result in complications if not addressed urgently such as respiratory distress, impairment of bodily function or dysfunction of bodily organs.   Routine symptom specific, illness specific and/or disease specific instructions were discussed with the patient and/or caregiver at length.   As such, the patient has been evaluated and assessed, work-up was performed and treatment was provided in alignment with urgent care protocols and evidence based medicine.  Patient/parent/caregiver has been advised that the patient may require follow up for further testing and treatment if the symptoms continue in spite of treatment, as clinically indicated and appropriate.  If the patient was tested for COVID-19, Influenza and/or RSV, then the patient/parent/guardian was advised to isolate at home pending the results of his/her diagnostic coronavirus test and potentially longer if  they're positive. I have also advised pt that if his/her COVID-19 test returns positive, it's recommended to self-isolate for at least 10 days after symptoms first appeared AND until fever-free for 24 hours without fever reducer AND other symptoms have improved or resolved. Discussed self-isolation recommendations as well as instructions for household member/close contacts as per the [facility] and [place] DHHS, and also gave patient the [facility] packet with this information.  Patient/parent/caregiver has been advised to return to the [facility] or PCP in 3-5 days if no better; to PCP or the Emergency Department if new signs and symptoms develop, or if the current signs or symptoms continue to change or worsen for further workup, evaluation and treatment as clinically indicated and appropriate  The patient will follow up with their current PCP if and as advised. If the patient does not currently have a PCP we will assist them in obtaining one.   The patient may need specialty follow up if the symptoms continue, in spite of conservative treatment and management, for further workup, evaluation, consultation and treatment as clinically indicated and appropriate.  Patient/parent/caregiver verbalized understanding and agreement of plan as discussed.  All questions were addressed during visit.  Please see discharge instructions below for further details of plan.  Discharge Instructions:   Discharge Instructions      Your symptoms and physical exam findings are concerning for a viral respiratory infection that has evolved into bronchitis as well as a bacterial infection in your bronchial airways.     Please see the list below for recommended medications, dosages and frequencies to provide relief of your current symptoms:    Augmentin (amoxicillin - clavulanic acid):  take 1 tablet twice daily for 7 days, you can take it with or without food.  This antibiotic can cause upset stomach, this will resolve once antibiotics are  complete.  You are welcome to use a probiotic, eat yogurt, take Imodium while taking this medication.  Please avoid other systemic medications such as Maalox, Pepto-Bismol or milk of magnesia as they can interfere with your body's ability to absorb the antibiotics.  I have provided you with a prescription.   It is very important that you take antibiotics as  prescribed.  If you skip doses or do not complete the full course of antibiotics, you put yourself at significant risk of recurrent infection which can often be worse than your initial infection.   Medrol Dosepak (methylprednisolone): This is a steroid that will significantly calm your upper and lower airways, please take one row of tablets daily with your breakfast meal starting tomorrow morning until the prescription is complete.   I have provided you with a prescription.   Allegra (fexofenadine): This is an excellent second-generation antihistamine that helps to reduce respiratory inflammatory response to environmental allergens.     Flonase (fluticasone): This is a steroid nasal spray that you use once daily, 1 spray in each nare.  This medication does not work well if you decide to use it only used as you feel you need to, it works best used on a daily basis.  After 3 to 5 days of use, you will notice significant reduction of the inflammation and mucus production that is currently being caused by exposure to allergens, whether seasonal or environmental.  The most common side effect of this medication is nosebleeds.  If you experience a nosebleed, please discontinue use for 1 week, then feel free to resume.     ProAir, Ventolin, Proventil (albuterol): This inhaled medication contains a short acting beta agonist bronchodilator.  This medication works on the smooth muscle that opens and constricts of your airways by relaxing the muscle.  The result of relaxation of the smooth muscle is increased air movement and improved work of breathing.  This is a  short acting medication that can be used every 4-6 hours as needed for increased work of breathing, shortness of breath, wheezing and excessive coughing.  I have provided you with a prescription.     Robitussin, Mucinex (guaifenesin): This is an expectorant.  This helps break up chest congestion and loosen up thick nasal drainage making phlegm and drainage more liquid and therefore easier to remove.  I recommend being 400 mg three times daily as needed.      Promethazine DM: Promethazine is both a nasal decongestant and an antinausea medication that makes most patients feel fairly sleepy.  The DM is dextromethorphan, a cough suppressant found in many over-the-counter cough medications.  Please take 5 mL before bedtime to minimize your cough which will help you sleep better.  I have provided you with a prescription for this medication.      Please follow-up within the next 5-7 days either with your primary care provider or urgent care if your symptoms do not resolve.  If you do not have a primary care provider, we will assist you in finding one.   Thank you for visiting urgent care today.  We appreciate the opportunity to participate in your care.     This office note has been dictated using Museum/gallery curator.  Unfortunately, and despite my best efforts, this method of dictation can sometimes lead to occasional typographical or grammatical errors.  I apologize in advance if this occurs.     Lynden Oxford Scales, PA-C 04/29/22 1312

## 2022-04-29 NOTE — Discharge Instructions (Signed)
Your symptoms and physical exam findings are concerning for a viral respiratory infection that has evolved into bronchitis as well as a bacterial infection in your bronchial airways.     Please see the list below for recommended medications, dosages and frequencies to provide relief of your current symptoms:    Augmentin (amoxicillin - clavulanic acid):  take 1 tablet twice daily for 7 days, you can take it with or without food.  This antibiotic can cause upset stomach, this will resolve once antibiotics are complete.  You are welcome to use a probiotic, eat yogurt, take Imodium while taking this medication.  Please avoid other systemic medications such as Maalox, Pepto-Bismol or milk of magnesia as they can interfere with your body's ability to absorb the antibiotics.  I have provided you with a prescription.   It is very important that you take antibiotics as prescribed.  If you skip doses or do not complete the full course of antibiotics, you put yourself at significant risk of recurrent infection which can often be worse than your initial infection.   Medrol Dosepak (methylprednisolone): This is a steroid that will significantly calm your upper and lower airways, please take one row of tablets daily with your breakfast meal starting tomorrow morning until the prescription is complete.   I have provided you with a prescription.   Allegra (fexofenadine): This is an excellent second-generation antihistamine that helps to reduce respiratory inflammatory response to environmental allergens.     Flonase (fluticasone): This is a steroid nasal spray that you use once daily, 1 spray in each nare.  This medication does not work well if you decide to use it only used as you feel you need to, it works best used on a daily basis.  After 3 to 5 days of use, you will notice significant reduction of the inflammation and mucus production that is currently being caused by exposure to allergens, whether seasonal or  environmental.  The most common side effect of this medication is nosebleeds.  If you experience a nosebleed, please discontinue use for 1 week, then feel free to resume.     ProAir, Ventolin, Proventil (albuterol): This inhaled medication contains a short acting beta agonist bronchodilator.  This medication works on the smooth muscle that opens and constricts of your airways by relaxing the muscle.  The result of relaxation of the smooth muscle is increased air movement and improved work of breathing.  This is a short acting medication that can be used every 4-6 hours as needed for increased work of breathing, shortness of breath, wheezing and excessive coughing.  I have provided you with a prescription.     Robitussin, Mucinex (guaifenesin): This is an expectorant.  This helps break up chest congestion and loosen up thick nasal drainage making phlegm and drainage more liquid and therefore easier to remove.  I recommend being 400 mg three times daily as needed.      Promethazine DM: Promethazine is both a nasal decongestant and an antinausea medication that makes most patients feel fairly sleepy.  The DM is dextromethorphan, a cough suppressant found in many over-the-counter cough medications.  Please take 5 mL before bedtime to minimize your cough which will help you sleep better.  I have provided you with a prescription for this medication.      Please follow-up within the next 5-7 days either with your primary care provider or urgent care if your symptoms do not resolve.  If you do not have a primary care  provider, we will assist you in finding one.   Thank you for visiting urgent care today.  We appreciate the opportunity to participate in your care.

## 2022-08-04 IMAGING — DX DG FOOT COMPLETE 3+V*L*
3 series · 3 of 3 positions shown · non-contrast
Comparison: Left foot radiographs 09/10/2009

CLINICAL DATA: Fourth metatarsal pain.  Hit on furniture 2 times.

EXAM:
LEFT FOOT - COMPLETE 3+ VIEW

[foot ap]
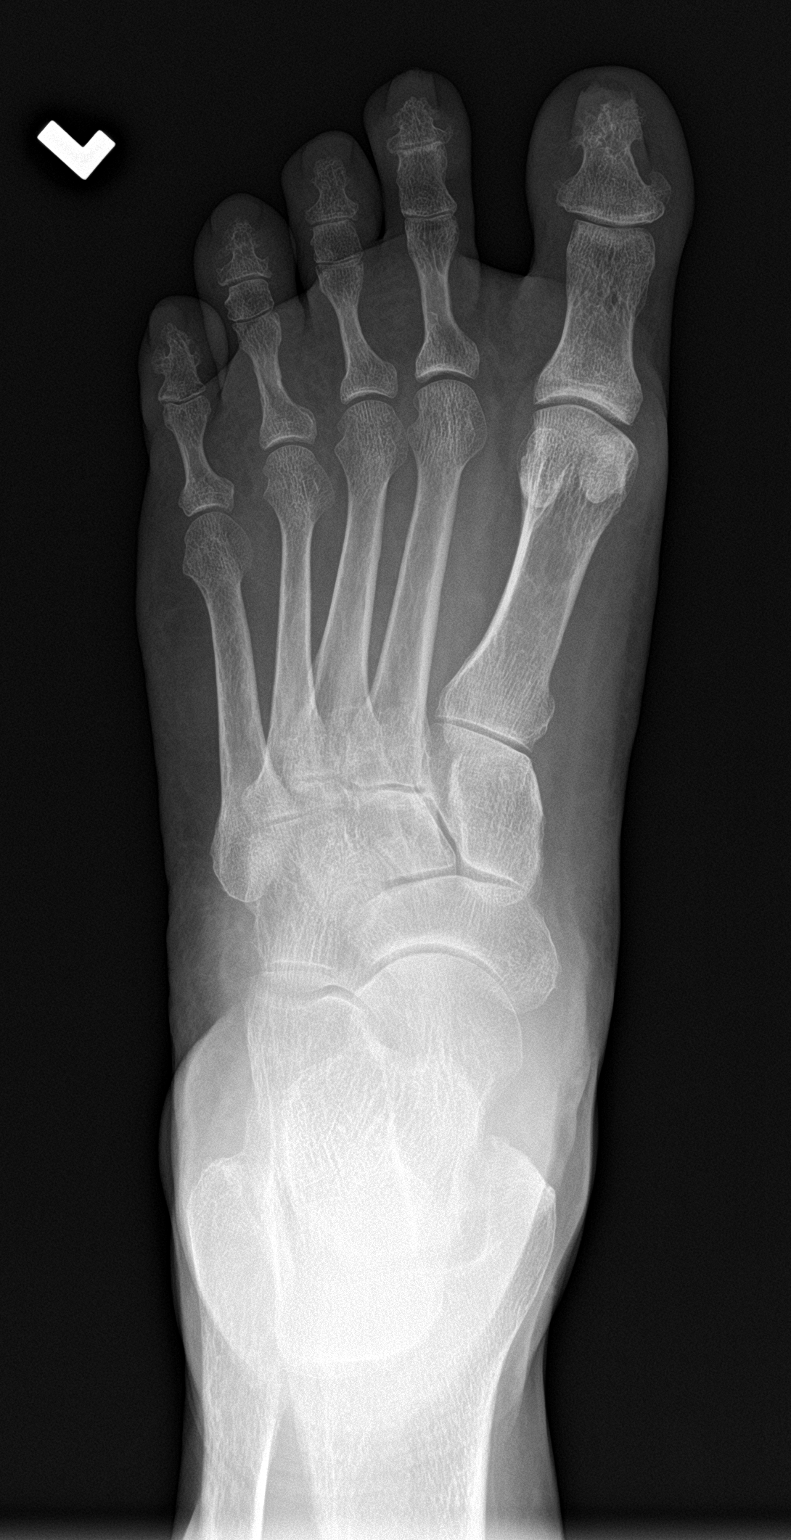

[foot obl]
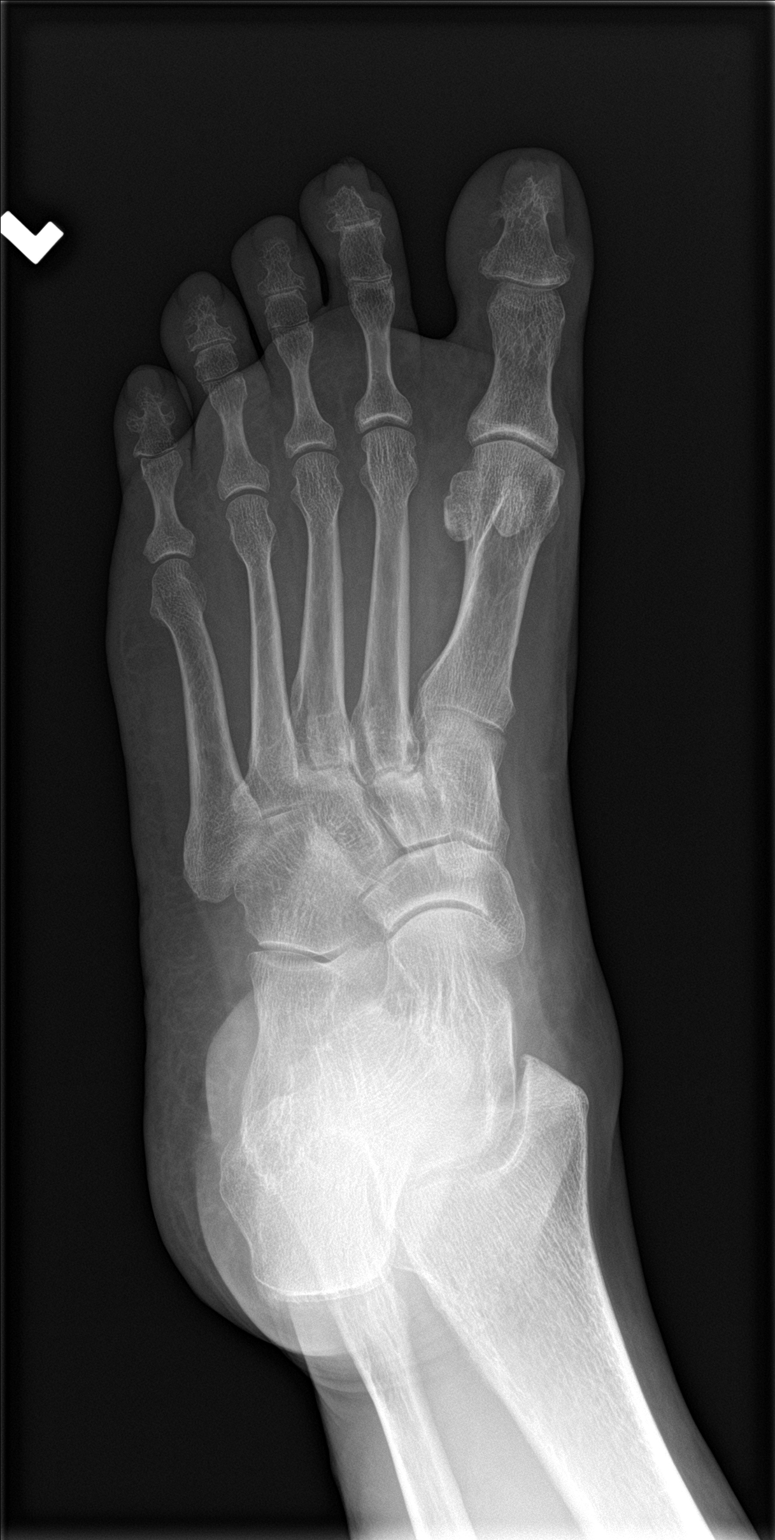

[foot lat]
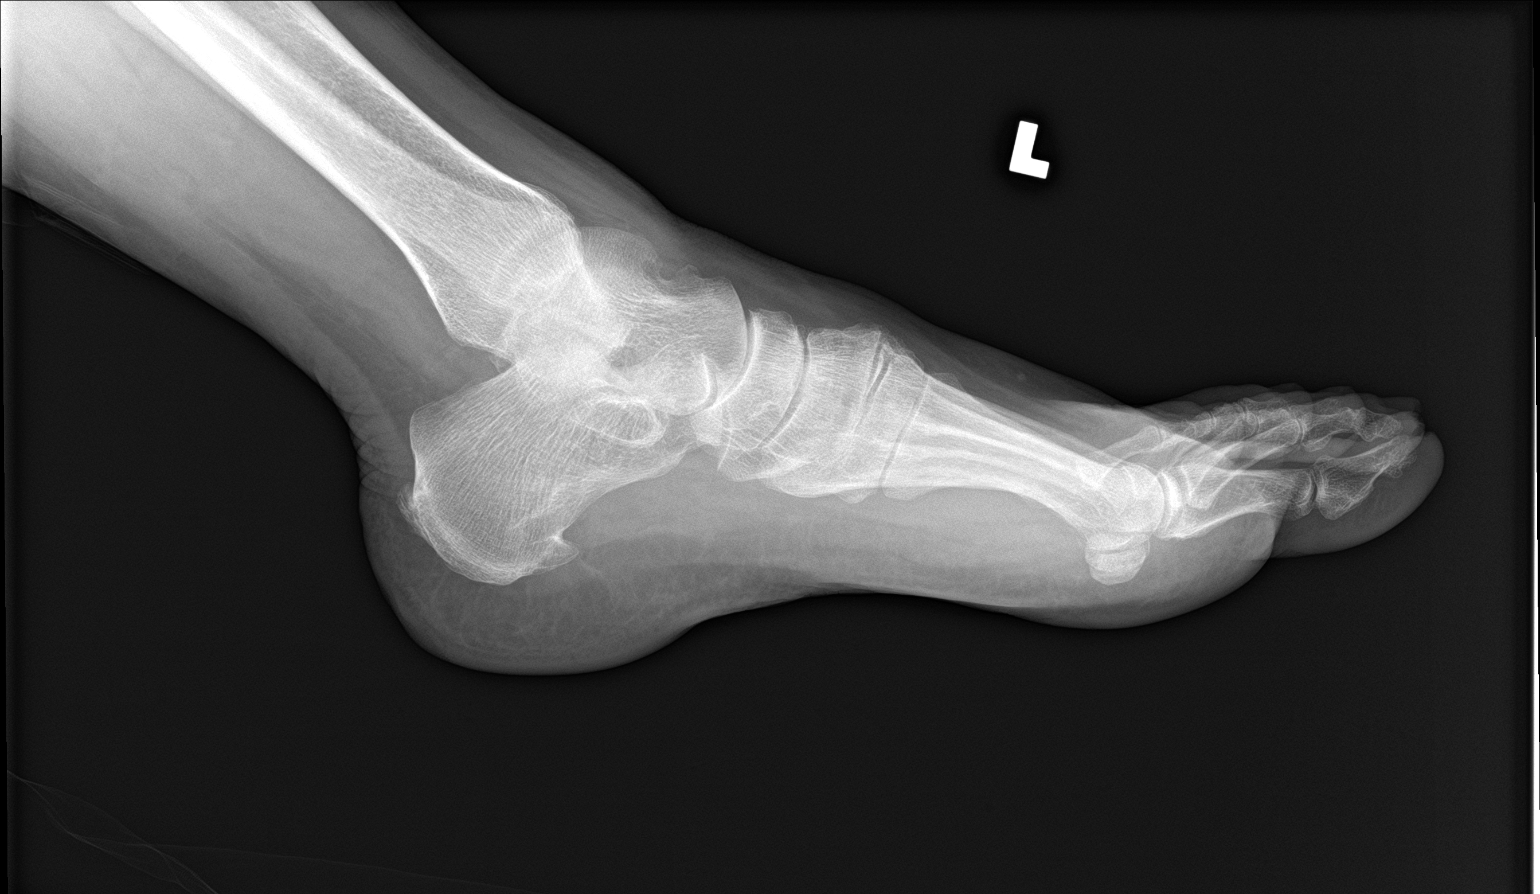

[3 of 3 positions shown; findings below may reference images not displayed]

FINDINGS: Findings there is again moderate calcaneal heel spur. Minimal
chronic enthesopathic change at the Achilles insertion on the
calcaneus. Mild dorsal tarsometatarsal degenerative osteophytes on
lateral view. New mild second and third tarsometatarsal joint space
narrowing and subchondral sclerosis degenerative change.
IMPRESSION: :
IMPRESSION: 1. Mild calcaneal heel spur.
2. Increased second and third tarsometatarsal mild osteoarthritis.

## 2022-10-18 DIAGNOSIS — E538 Deficiency of other specified B group vitamins: Secondary | ICD-10-CM | POA: Diagnosis not present

## 2022-10-18 DIAGNOSIS — Z0000 Encounter for general adult medical examination without abnormal findings: Secondary | ICD-10-CM | POA: Diagnosis not present

## 2022-10-18 DIAGNOSIS — M797 Fibromyalgia: Secondary | ICD-10-CM | POA: Diagnosis not present

## 2022-10-18 DIAGNOSIS — E559 Vitamin D deficiency, unspecified: Secondary | ICD-10-CM | POA: Diagnosis not present

## 2022-11-03 ENCOUNTER — Ambulatory Visit
Admission: EM | Admit: 2022-11-03 | Discharge: 2022-11-03 | Disposition: A | Discharge status: Home / Self Care | Payer: BC Managed Care – PPO | Attending: Urgent Care | Admitting: Urgent Care

## 2022-11-03 ENCOUNTER — Encounter: Payer: Self-pay | Admitting: Urgent Care

## 2022-11-03 DIAGNOSIS — J209 Acute bronchitis, unspecified: Secondary | ICD-10-CM | POA: Diagnosis not present

## 2022-11-03 DIAGNOSIS — J4530 Mild persistent asthma, uncomplicated: Secondary | ICD-10-CM | POA: Diagnosis not present

## 2022-11-03 MED ORDER — PREDNISONE 50 MG PO TABS
50.0000 mg | ORAL_TABLET | Freq: Every day | ORAL | 0 refills | Status: DC
Start: 2022-11-03 — End: 2022-12-25

## 2022-11-03 MED ORDER — PROMETHAZINE-DM 6.25-15 MG/5ML PO SYRP: 5.0000 mL | ORAL_SOLUTION | Freq: Three times a day (TID) | ORAL | 0 refills | Status: DC | PRN

## 2022-11-03 NOTE — ED Provider Notes (Signed)
[facility]  Note:  This document was prepared using Systems analyst and may include unintentional dictation errors.  MRN: [id number] DOB: 11/01/1961  Subjective:   Karla [name] is a 61 y.o. female presenting for 5 day history of acute onset persistent coughing, chest congestion. Has history of asthma, bronchitis, shob, has been using her inhaler. Does not want a COVID test.  Patient is not a smoker.  No current facility-administered medications for this encounter.  Current Outpatient Medications:    albuterol (VENTOLIN HFA) 108 (90 Base) MCG/ACT inhaler, Inhale 2 puffs into the lungs every 6 (six) hours as needed for wheezing or shortness of breath (Cough)., Disp: 18 g, Rfl: 0   Ascorbic Acid (VITAMIN C) 1000 MG tablet, Take 1,000 mg by mouth daily., Disp: , Rfl:    aspirin EC 81 MG tablet, Take 1 tablet (81 mg total) by mouth daily., Disp: 90 tablet, Rfl: 3   fexofenadine-pseudoephedrine (ALLEGRA-D 24) 180-240 MG 24 hr tablet, Take 1 tablet by mouth as needed (SEASONAL ALLERGIES). , Disp: , Rfl:    fluticasone (FLONASE) 50 MCG/ACT nasal spray, Place 2 sprays into both nostrils daily., Disp: 16 g, Rfl: 2   Multiple Vitamins-Minerals (MULTIVITAMIN WITH MINERALS) tablet, Take 1 tablet by mouth daily., Disp: , Rfl:    pantoprazole (PROTONIX) 40 MG tablet, Take 40 mg by mouth daily., Disp: , Rfl:    Allergies  Allergen Reactions   Sodium Pentobarbital [Pentobarbital] Other (See Comments) and Nausea And Vomiting    Severe nausea.    Past Medical History:  Diagnosis Date   Allergy    SEASONAL   Anemia    Asthma    Avascular necrosis ([place])    knee,left   Avascular necrosis ([place])    LEFT KNEE   Blood transfusion without reported diagnosis    Chicken pox    Childhood asthma    Easy bruising    Eczema    hands   ESR raised    ESR raised    Family history of ovarian cancer    Fibromyalgia    GERD (gastroesophageal reflux  disease)    H/O gastric bypass    History of anemia    Iron deficiency    Kidney stone    Nephrolithiasis    Osteopenia    Tubular adenoma of colon    Ulcer    2004   Vitamin C deficiency    Vitamin D deficiency      Past Surgical History:  Procedure Laterality Date   ABDOMINAL HYSTERECTOMY  2000   w/opph   APPENDECTOMY     BREAST BIOPSY Right 1997   CHOLECYSTECTOMY  2003   DENTAL SURGERY     All Upper Removed   GASTRIC BYPASS  2003   LEFT HEART CATHETERIZATION WITH CORONARY ANGIOGRAM N/A 11/15/2014   Procedure: LEFT HEART CATHETERIZATION WITH CORONARY ANGIOGRAM;  Surgeon: Troy Sine, MD;  Location: [facility] CATH LAB;  Service: Cardiovascular;  Laterality: N/A;   left knee replacement  01.2011    Family History  Problem Relation Age of Onset   Asthma Mother        Deceased   Cancer - Other Mother        Esophageal 47s; deceased 31; smoker   Other Mother 45       Ruptured Bowel   GER disease Mother    Coronary artery disease Mother    Rashes / Skin problems Mother    Heart disease  Mother    Heart attack Mother    Diabetes Father        Deceased   Heart attack Father    Coronary artery disease Father    Heart disease Father    Throat cancer Maternal Grandmother        Deceased 30s   Cancer - Other Maternal Grandmother        throat; deceased 31s   Ovarian cancer Maternal Aunt        Deceased late 73s   Healthy Brother    Arthritis/Rheumatoid Daughter    Rashes / Skin problems Daughter        psoriasis   Psoriasis Daughter    Ovarian cancer Sister        49yo; maternal half-sister; "gene positive"   Cancer - Other Paternal Grandmother        "female cancer"; deceased 37s   Cancer - Other Maternal Aunt        "female cancer" 47s    Social History   Tobacco Use   Smoking status: Former    Types: Cigarettes    Quit date: 02/15/2012    Years since quitting: 10.7   Smokeless tobacco: Never   Tobacco comments:    quit 09/2010  Substance Use Topics    Alcohol use: No   Drug use: No    ROS   Objective:   Vitals: BP (!) 145/66 (BP Location: Left Arm)   Pulse (!) 51   Temp 97.6 F (36.4 C) (Oral)   SpO2 97%   Physical Exam Constitutional:      General: She is not in acute distress.    Appearance: Normal appearance. She is well-developed. She is not ill-appearing, toxic-appearing or diaphoretic.  HENT:     Head: Normocephalic and atraumatic.     Nose: Nose normal.     Mouth/Throat:     Mouth: Mucous membranes are moist.  Eyes:     General: No scleral icterus.       Right eye: No discharge.        Left eye: No discharge.     Extraocular Movements: Extraocular movements intact.  Cardiovascular:     Rate and Rhythm: Normal rate and regular rhythm.     Heart sounds: Normal heart sounds. No murmur heard.    No friction rub. No gallop.  Pulmonary:     Effort: Pulmonary effort is normal. No respiratory distress.     Breath sounds: No stridor. No wheezing, rhonchi or rales.  Chest:     Chest wall: No tenderness.  Skin:    General: Skin is warm and dry.  Neurological:     General: No focal deficit present.     Mental Status: She is alert and oriented to person, place, and time.  Psychiatric:        Mood and Affect: Mood normal.        Behavior: Behavior normal.     Assessment and Plan :   PDMP not reviewed this encounter.  1. Acute bronchitis, unspecified organism   2. Mild persistent asthma without complication     Deferred imaging given clear cardiopulmonary exam, hemodynamically stable vital signs.  We will manage her bronchitis with supportive care as it is almost exclusively viral.  Recommended a oral prednisone course in the context of her asthma and respiratory symptoms. Counseled patient on potential for adverse effects with medications prescribed/recommended today, ER and return-to-clinic precautions discussed, patient verbalized understanding.    Jaynee Eagles, PA-C 11/03/22  [place]

## 2022-11-10 DIAGNOSIS — Z1231 Encounter for screening mammogram for malignant neoplasm of breast: Secondary | ICD-10-CM | POA: Diagnosis not present

## 2022-11-10 DIAGNOSIS — M85852 Other specified disorders of bone density and structure, left thigh: Secondary | ICD-10-CM | POA: Diagnosis not present

## 2022-11-10 DIAGNOSIS — M85851 Other specified disorders of bone density and structure, right thigh: Secondary | ICD-10-CM | POA: Diagnosis not present

## 2022-11-10 DIAGNOSIS — Z780 Asymptomatic menopausal state: Secondary | ICD-10-CM | POA: Diagnosis not present

## 2022-11-16 DIAGNOSIS — D72829 Elevated white blood cell count, unspecified: Secondary | ICD-10-CM | POA: Diagnosis not present

## 2022-11-18 DIAGNOSIS — N6489 Other specified disorders of breast: Secondary | ICD-10-CM | POA: Diagnosis not present

## 2022-11-18 DIAGNOSIS — R928 Other abnormal and inconclusive findings on diagnostic imaging of breast: Secondary | ICD-10-CM | POA: Diagnosis not present

## 2022-11-18 DIAGNOSIS — N6311 Unspecified lump in the right breast, upper outer quadrant: Secondary | ICD-10-CM | POA: Diagnosis not present

## 2022-11-24 DIAGNOSIS — N6012 Diffuse cystic mastopathy of left breast: Secondary | ICD-10-CM | POA: Diagnosis not present

## 2022-11-24 DIAGNOSIS — N6311 Unspecified lump in the right breast, upper outer quadrant: Secondary | ICD-10-CM | POA: Diagnosis not present

## 2022-11-24 DIAGNOSIS — N6011 Diffuse cystic mastopathy of right breast: Secondary | ICD-10-CM | POA: Diagnosis not present

## 2022-11-24 DIAGNOSIS — N6315 Unspecified lump in the right breast, overlapping quadrants: Secondary | ICD-10-CM | POA: Diagnosis not present

## 2022-11-24 DIAGNOSIS — N6321 Unspecified lump in the left breast, upper outer quadrant: Secondary | ICD-10-CM | POA: Diagnosis not present

## 2022-11-24 DIAGNOSIS — C50811 Malignant neoplasm of overlapping sites of right female breast: Secondary | ICD-10-CM | POA: Diagnosis not present

## 2022-11-26 ENCOUNTER — Telehealth: Payer: Self-pay | Admitting: Hematology and Oncology

## 2022-11-26 NOTE — Telephone Encounter (Signed)
Spoke to patient to confirm upcoming morning [name] appointment on 1/3, paperwork will be sent via Day.   Gave location and time, also informed patient that the surgeon's office would be calling as well to get information from them similar to the packet that they will be receiving so make sure to do both.  Reminded patient that all providers will be coming to the clinic to see them HERE and if they had any questions to not hesitate to reach back out to myself or their navigators.

## 2022-11-30 ENCOUNTER — Encounter: Payer: Self-pay | Admitting: Family Medicine

## 2022-12-03 ENCOUNTER — Encounter: Payer: Self-pay | Admitting: *Deleted

## 2022-12-03 DIAGNOSIS — Z170 Estrogen receptor positive status [ER+]: Secondary | ICD-10-CM | POA: Insufficient documentation

## 2022-12-07 NOTE — Progress Notes (Addendum)
Radiation Oncology         [phone] ________________________________  Name: Karla [name]        MRN: [id number]  Date of Service: 12/08/2022 DOB: July 03, 1961  [id number], Chrissie Noa, MD  Emelia Loron, MD     REFERRING PHYSICIAN: Emelia Loron, MD   DIAGNOSIS: The encounter diagnosis was Malignant neoplasm of upper-outer quadrant of right breast in female, estrogen receptor positive (HCC).   Attestation Please see the note from Laurence Aly, PA-C from today's visit for more details of today's encounter.  I have personally performed a face to face diagnostic evaluation on this patient and devised the following assessment and plan.  The patient underwent evaluation for her new diagnosis of right-sided breast cancer.  She was seen today in multidisciplinary breast clinic.  She had several findings which have been reviewed including negative findings within the left breast and a further negative finding of 1 of 3 areas which was examined in detail within the right breast.  Biopsy of 2 remaining suspicious areas showed 1 area which appears to represent a mucocele but excision was recommended to rule out cancer.  An area at the 12 o'clock position revealed grade 2 invasive ductal carcinoma.  The patient does appear to be a good candidate for lumpectomy and an excision of the other remaining area at the 10 o'clock position.  An Oncotype test is anticipated and a standard hypofractionated course of radiation treatment also was discussed with the patient.  I would anticipate 4 weeks of radiation treatment.  All of her questions were answered and she is comfortable with the plan moving forward.  I look forward to seeing her postoperatively to further review and coordinate her care at that time.   Dorothy Puffer, MD    HISTORY OF PRESENT ILLNESS: Karla [name] is a 62 y.o. female seen in the multidisciplinary breast clinic for a new diagnosis of right breast cancer. The patient  was noted to have focal asymmetry in the right breast at 12 o'clock position and a mass in the right UOQ on screening mammogram. Diagnostic mammogram on 11/10/22 confirmed a 1.1 cm irregular mass in the right breast at 10 o'clock position and revealed a 0.8 cm oval mass in the right breast. U/S on 11/18/22 confirmed the these findings. Right breast needle core biopsy at 1:00 6 cmfn showed grade 2 IDC ER/PR 100% strong staining, Ki-67 of 5% and HER2 negative. Right breast needle core biopsy at 10:00 8 to 12 cmfn showed mucocele like lesion. She is seen today to discuss treatment recommendations of her cancer.       PREVIOUS RADIATION THERAPY: No   PAST MEDICAL HISTORY:  Past Medical History:  Diagnosis Date   Allergy    SEASONAL   Anemia    Asthma    Avascular necrosis (HCC)    knee,left   Avascular necrosis (HCC)    LEFT KNEE   Blood transfusion without reported diagnosis    Chicken pox    Childhood asthma    Easy bruising    Eczema    hands   ESR raised    ESR raised    Family history of ovarian cancer    Fibromyalgia    GERD (gastroesophageal reflux disease)    H/O gastric bypass    History of anemia    Iron deficiency    Kidney stone    Nephrolithiasis    Osteopenia    Tubular adenoma of colon    Ulcer  2004   Vitamin C deficiency    Vitamin D deficiency        PAST SURGICAL HISTORY: Past Surgical History:  Procedure Laterality Date   ABDOMINAL HYSTERECTOMY  2000   w/opph   APPENDECTOMY     BREAST BIOPSY Right 1997   CHOLECYSTECTOMY  2003   DENTAL SURGERY     All Upper Removed   GASTRIC BYPASS  2003   LEFT HEART CATHETERIZATION WITH CORONARY ANGIOGRAM N/A 11/15/2014   Procedure: LEFT HEART CATHETERIZATION WITH CORONARY ANGIOGRAM;  Surgeon: Lennette Bihari, MD;  Location: [facility] CATH LAB;  Service: Cardiovascular;  Laterality: N/A;   left knee replacement  01.2011     FAMILY HISTORY:  Family History  Problem Relation Age of Onset   Asthma Mother         Deceased   Cancer - Other Mother        Esophageal 21s; deceased 43; smoker   Other Mother 62       Ruptured Bowel   GER disease Mother    Coronary artery disease Mother    Rashes / Skin problems Mother    Heart disease Mother    Heart attack Mother    Diabetes Father        Deceased   Heart attack Father    Coronary artery disease Father    Heart disease Father    Throat cancer Maternal Grandmother        Deceased 3s   Cancer - Other Maternal Grandmother        throat; deceased 62s   Ovarian cancer Maternal Aunt        Deceased late 42s   Healthy Brother    Arthritis/Rheumatoid Daughter    Rashes / Skin problems Daughter        psoriasis   Psoriasis Daughter    Ovarian cancer Sister        37yo; maternal half-sister; "gene positive"   Cancer - Other Paternal Grandmother        "female cancer"; deceased 28s   Cancer - Other Maternal Aunt        "female cancer" 34s     SOCIAL HISTORY:  reports that she quit smoking about 10 years ago. She has never used smokeless tobacco. She reports that she does not drink alcohol and does not use drugs.   ALLERGIES: Sodium pentobarbital [pentobarbital] and Other   MEDICATIONS:  Current Outpatient Medications  Medication Sig Dispense Refill   albuterol (VENTOLIN HFA) 108 (90 Base) MCG/ACT inhaler Inhale 2 puffs into the lungs every 6 (six) hours as needed for wheezing or shortness of breath (Cough). (Patient not taking: Reported on 12/08/2022) 18 g 0   Ascorbic Acid (VITAMIN C) 1000 MG tablet Take 1,000 mg by mouth daily. (Patient not taking: Reported on 12/08/2022)     aspirin EC 81 MG tablet Take 1 tablet (81 mg total) by mouth daily. 90 tablet 3   fexofenadine-pseudoephedrine (ALLEGRA-D 24) 180-240 MG 24 hr tablet Take 1 tablet by mouth as needed (SEASONAL ALLERGIES).  (Patient not taking: Reported on 12/08/2022)     fluticasone (FLONASE) 50 MCG/ACT nasal spray Place 2 sprays into both nostrils daily. 16 g 2   Multiple Vitamin  (MULTIVITAMIN) capsule Take 1 capsule by mouth daily.     pantoprazole (PROTONIX) 40 MG tablet Take 40 mg by mouth daily.     predniSONE (DELTASONE) 50 MG tablet Take 1 tablet (50 mg total) by mouth daily with breakfast. 5 tablet 0  promethazine-dextromethorphan (PROMETHAZINE-DM) 6.25-15 MG/5ML syrup Take 5 mLs by mouth 3 (three) times daily as needed for cough. 100 mL 0   No current facility-administered medications for this encounter.     REVIEW OF SYSTEMS: On review of systems, the patient reports that she is doing well overall. No breast specific complaints are verbalized.       PHYSICAL EXAM:  Wt Readings from Last 3 Encounters:  12/08/22 242 lb 1.6 oz (109.8 kg)  06/12/20 250 lb (113.4 kg)  11/11/16 239 lb 6.4 oz (108.6 kg)   Temp Readings from Last 3 Encounters:  12/08/22 97.6 F (36.4 C) (Temporal)  11/03/22 97.6 F (36.4 C) (Oral)  04/29/22 98.9 F (37.2 C)   BP Readings from Last 3 Encounters:  12/08/22 (!) 166/85  11/03/22 (!) 145/66  01/25/22 139/82   Pulse Readings from Last 3 Encounters:  12/08/22 64  11/03/22 (!) 51  04/29/22 (!) 54    In general this is a well appearing female in no acute distress. She's alert and oriented x4 and appropriate throughout the examination. Cardiopulmonary assessment is negative for acute distress and she exhibits normal effort. Bilateral breast exam is deferred.    ECOG = 0  0 - Asymptomatic (Fully active, able to carry on all predisease activities without restriction)  1 - Symptomatic but completely ambulatory (Restricted in physically strenuous activity but ambulatory and able to carry out work of a light or sedentary nature. For example, light housework, office work)  2 - Symptomatic, <50% in bed during the day (Ambulatory and capable of all self care but unable to carry out any work activities. Up and about more than 50% of waking hours)  3 - Symptomatic, >50% in bed, but not bedbound (Capable of only limited  self-care, confined to bed or chair 50% or more of waking hours)  4 - Bedbound (Completely disabled. Cannot carry on any self-care. Totally confined to bed or chair)  5 - Death   Santiago Glad MM, Creech RH, Tormey DC, et al. [phone]). "Toxicity and response criteria of the [facility]". Am. Evlyn Clines. Oncol. 5 (6): 649-55    LABORATORY DATA:  Lab Results  Component Value Date   WBC 10.4 12/08/2022   HGB 13.3 12/08/2022   HCT 39.9 12/08/2022   MCV 86.0 12/08/2022   PLT 260 12/08/2022   Lab Results  Component Value Date   NA 139 12/08/2022   K 4.2 12/08/2022   CL 107 12/08/2022   CO2 27 12/08/2022   Lab Results  Component Value Date   ALT 23 12/08/2022   AST 21 12/08/2022   ALKPHOS 160 (H) 12/08/2022   BILITOT 0.3 12/08/2022      RADIOGRAPHY: I have personally reviewed the radiological reports and agreed with the findings in the report.      IMPRESSION/PLAN: 1. Intermediate grade, ER/PR positive invasive ductal carcinoma of the right breast. Dr. Mitzi Hansen discusses the pathology findings and reviews the nature of early stage invasive ductal carcinoma. The consensus from the breast conference includes lumpectomy with removal of mucocele lesion followed by oncotype testing to determine if she would benefit from chemotherapy. If she were to receive chemotherapy, she would undergo radiation therapy approximately 4 weeks after completing chemotherapy. Dr. Mitzi Hansen would recommend external radiotherapy to the breast to reduce risks of local recurrence followed by antiestrogen therapy. We discussed the risks, benefits, short, and long term effects of radiotherapy, as well as the curative intent, and the patient is interested in proceeding. Dr.  Mitzi Hansen discusses the delivery and logistics of radiotherapy and anticipates a course of 4-6 weeks of radiotherapy to the right breast. We will see her back a few weeks after surgery to discuss the simulation process and anticipate starting  radiotherapy about 4-6 weeks after surgery.    In a visit lasting 60 minutes, greater than 50% of the time was spent face to face reviewing her case, as well as in preparation of, discussing, and coordinating the patient's care.  The above documentation reflects my direct findings during this shared patient visit. Please see the separate note by Dr. Mitzi Hansen on this date for the remainder of the patient's plan of care.    Joyice Faster, [place]    **Disclaimer: This note was dictated with voice recognition software. Similar sounding words can inadvertently be transcribed and this note may contain transcription errors which may not have been corrected upon publication of note.**

## 2022-12-08 ENCOUNTER — Ambulatory Visit: Payer: BC Managed Care – PPO | Attending: General Surgery | Admitting: Physical Therapy

## 2022-12-08 ENCOUNTER — Other Ambulatory Visit: Payer: Self-pay

## 2022-12-08 ENCOUNTER — Ambulatory Visit
Admission: RE | Admit: 2022-12-08 | Discharge: 2022-12-08 | Disposition: A | Discharge status: Home / Self Care | Payer: BC Managed Care – PPO | Source: Ambulatory Visit | Attending: Radiation Oncology | Admitting: Radiation Oncology

## 2022-12-08 ENCOUNTER — Inpatient Hospital Stay: Payer: BC Managed Care – PPO

## 2022-12-08 ENCOUNTER — Inpatient Hospital Stay: Payer: BC Managed Care – PPO | Attending: Hematology and Oncology

## 2022-12-08 ENCOUNTER — Other Ambulatory Visit: Payer: Self-pay | Admitting: General Surgery

## 2022-12-08 ENCOUNTER — Encounter: Payer: Self-pay | Admitting: *Deleted

## 2022-12-08 ENCOUNTER — Inpatient Hospital Stay (HOSPITAL_BASED_OUTPATIENT_CLINIC_OR_DEPARTMENT_OTHER): Payer: BC Managed Care – PPO | Admitting: Hematology and Oncology

## 2022-12-08 ENCOUNTER — Encounter: Payer: Self-pay | Admitting: Hematology and Oncology

## 2022-12-08 ENCOUNTER — Encounter: Payer: Self-pay | Admitting: Physical Therapy

## 2022-12-08 ENCOUNTER — Inpatient Hospital Stay: Payer: BC Managed Care – PPO | Admitting: Licensed Clinical Social Worker

## 2022-12-08 ENCOUNTER — Inpatient Hospital Stay (HOSPITAL_BASED_OUTPATIENT_CLINIC_OR_DEPARTMENT_OTHER): Payer: BC Managed Care – PPO | Admitting: Genetic Counselor

## 2022-12-08 ENCOUNTER — Encounter: Payer: Self-pay | Admitting: Genetic Counselor

## 2022-12-08 VITALS — BP 166/85 | HR 64 | Temp 97.6°F | Resp 16 | Ht 65.0 in | Wt 242.1 lb

## 2022-12-08 DIAGNOSIS — Z170 Estrogen receptor positive status [ER+]: Secondary | ICD-10-CM

## 2022-12-08 DIAGNOSIS — Z79899 Other long term (current) drug therapy: Secondary | ICD-10-CM | POA: Insufficient documentation

## 2022-12-08 DIAGNOSIS — Z87891 Personal history of nicotine dependence: Secondary | ICD-10-CM | POA: Diagnosis not present

## 2022-12-08 DIAGNOSIS — M8580 Other specified disorders of bone density and structure, unspecified site: Secondary | ICD-10-CM | POA: Diagnosis not present

## 2022-12-08 DIAGNOSIS — Z803 Family history of malignant neoplasm of breast: Secondary | ICD-10-CM | POA: Diagnosis not present

## 2022-12-08 DIAGNOSIS — Z853 Personal history of malignant neoplasm of breast: Secondary | ICD-10-CM | POA: Diagnosis not present

## 2022-12-08 DIAGNOSIS — C50411 Malignant neoplasm of upper-outer quadrant of right female breast: Secondary | ICD-10-CM

## 2022-12-08 DIAGNOSIS — Z7952 Long term (current) use of systemic steroids: Secondary | ICD-10-CM | POA: Diagnosis not present

## 2022-12-08 DIAGNOSIS — Z8041 Family history of malignant neoplasm of ovary: Secondary | ICD-10-CM

## 2022-12-08 DIAGNOSIS — J45909 Unspecified asthma, uncomplicated: Secondary | ICD-10-CM | POA: Insufficient documentation

## 2022-12-08 DIAGNOSIS — Z7982 Long term (current) use of aspirin: Secondary | ICD-10-CM | POA: Insufficient documentation

## 2022-12-08 DIAGNOSIS — Z9884 Bariatric surgery status: Secondary | ICD-10-CM | POA: Insufficient documentation

## 2022-12-08 DIAGNOSIS — R293 Abnormal posture: Secondary | ICD-10-CM | POA: Insufficient documentation

## 2022-12-08 LAB — CBC WITH DIFFERENTIAL (CANCER CENTER ONLY)
Abs Immature Granulocytes: 0.03 10*3/uL (ref 0.00–0.07)
Basophils Absolute: 0.1 10*3/uL (ref 0.0–0.1)
Basophils Relative: 1 %
Eosinophils Absolute: 0.3 10*3/uL (ref 0.0–0.5)
Eosinophils Relative: 3 %
HCT: 39.9 % (ref 36.0–46.0)
Hemoglobin: 13.3 g/dL (ref 12.0–15.0)
Immature Granulocytes: 0 %
Lymphocytes Relative: 27 %
Lymphs Abs: 2.8 10*3/uL (ref 0.7–4.0)
MCH: 28.7 pg (ref 26.0–34.0)
MCHC: 33.3 g/dL (ref 30.0–36.0)
MCV: 86 fL (ref 80.0–100.0)
Monocytes Absolute: 0.5 10*3/uL (ref 0.1–1.0)
Monocytes Relative: 5 %
Neutro Abs: 6.8 10*3/uL (ref 1.7–7.7)
Neutrophils Relative %: 64 %
Platelet Count: 260 10*3/uL (ref 150–400)
RBC: 4.64 MIL/uL (ref 3.87–5.11)
RDW: 13.6 % (ref 11.5–15.5)
WBC Count: 10.4 10*3/uL (ref 4.0–10.5)
nRBC: 0 % (ref 0.0–0.2)

## 2022-12-08 LAB — CMP (CANCER CENTER ONLY)
ALT: 23 U/L (ref 0–44)
AST: 21 U/L (ref 15–41)
Albumin: 4 g/dL (ref 3.5–5.0)
Alkaline Phosphatase: 160 U/L — ABNORMAL HIGH (ref 38–126)
Anion gap: 5 (ref 5–15)
BUN: 10 mg/dL (ref 6–20)
CO2: 27 mmol/L (ref 22–32)
Calcium: 9.2 mg/dL (ref 8.9–10.3)
Chloride: 107 mmol/L (ref 98–111)
Creatinine: 0.64 mg/dL (ref 0.44–1.00)
GFR, Estimated: >60 mL/min (ref >60)
Glucose, Bld: 97 mg/dL (ref 70–99)
Potassium: 4.2 mmol/L (ref 3.5–5.1)
Sodium: 139 mmol/L (ref 135–145)
Total Bilirubin: 0.3 mg/dL (ref 0.3–1.2)
Total Protein: 6.7 g/dL (ref 6.5–8.1)

## 2022-12-08 LAB — GENETIC SCREENING ORDER

## 2022-12-08 LAB — RESEARCH LABS

## 2022-12-08 NOTE — Assessment & Plan Note (Signed)
This is a very pleasant 62 year old likely postmenopausal, had hysterectomy 20 years ago referred to breast [place] with new diagnosis of right breast grade 2 IDC, ER/PR 100% strong staining, Ki-67 of 5%, HER2 negative.  She also has a mucocele in the right breast at 10:00 which will be excised.  We have reviewed her findings including imaging and pathology in the breast [place].  Dr. Donne Hazel will discuss with her about the utility of MRI in the setting.  At this time since given strong ER/PR positivity and small tumor, we have discussed about upfront surgery followed by consideration for Oncotype testing.  We have discussed about Oncotype Dx score which is a well validated prognostic scoring system which can predict outcome with endocrine therapy alone and whether chemotherapy reduces recurrence.  Typically in patients with ER positive cancers that are node negative if the RS score is high typically greater than or equal to 26, chemotherapy is recommended.  In women with intermediate recurrence score younger than 67, there can still be some role for chemotherapy in addition to endocrine therapy especially if the recurrence score is between 21-25. If chemotherapy is needed, this will precede radiation and then after radiation she will continue on antiestrogen therapy.  She is less likely to need adjuvant chemotherapy based on pathology so far.  She will return to clinic after surgery to review final pathology and additional recommendations.  We have also discussed about antiestrogen therapy in detail today.  We have discussed options for antiestrogen therapy today  With regards to Tamoxifen, we discussed that this is a SERM, selective estrogen receptor modulator. We discussed mechanism of action of Tamoxifen, adverse effects on Tamoxifen including but not limited to post menopausal symptoms, increased risk of DVT/PE, increased risk of endometrial cancer, questionable cataracts with long term use and increased  risk of cardiovascular events in the study which was not statistically significant. A benefit from Tamoxifen would be improvement in bone density. With regards to aromatase inhibitors, we discussed mechanism of action, adverse effects including but not limited to post menopausal symptoms, arthralgias, myalgias, increased risk of cardiovascular events and bone loss.  Tamoxifen can be used in premenopausal and post menopausal women. Aromatase inhibitors can only be used in premenopausal women along with OFS.   She is currently interested in pursuing aromatase inhibitors.  She will return to clinic after surgery and radiation to initiate antiestrogen therapy. All her questions were answered to the best my knowledge.  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  Thank you for consulting [place] in the care of this patient.  Please do not hesitate to contact us with any additional questions or concerns.

## 2022-12-08 NOTE — Therapy (Signed)
OUTPATIENT PHYSICAL THERAPY BREAST CANCER BASELINE EVALUATION   Patient Name: Karla [name] MRN: [id number] DOB:1961/09/11, 62 y.o., female Today's Date: 12/08/2022  END OF SESSION:  PT End of Session - 12/08/22 1100     Visit Number 1    Number of Visits 2    Date for PT Re-Evaluation 02/02/23    PT Start Time 0905    PT Stop Time 0935    PT Time Calculation (min) 30 min    Activity Tolerance Patient tolerated treatment well    Behavior During Therapy WFL for tasks assessed/performed             Past Medical History:  Diagnosis Date   Allergy    SEASONAL   Anemia    Asthma    Avascular necrosis ([place])    knee,left   Avascular necrosis ([place])    LEFT KNEE   Blood transfusion without reported diagnosis    Chicken pox    Childhood asthma    Easy bruising    Eczema    hands   ESR raised    ESR raised    Family history of ovarian cancer    Fibromyalgia    GERD (gastroesophageal reflux disease)    H/O gastric bypass    History of anemia    Iron deficiency    Kidney stone    Nephrolithiasis    Osteopenia    Tubular adenoma of colon    Ulcer    2004   Vitamin C deficiency    Vitamin D deficiency    Past Surgical History:  Procedure Laterality Date   ABDOMINAL HYSTERECTOMY  2000   w/opph   APPENDECTOMY     BREAST BIOPSY Right 1997   CHOLECYSTECTOMY  2003   DENTAL SURGERY     All Upper Removed   GASTRIC BYPASS  2003   LEFT HEART CATHETERIZATION WITH CORONARY ANGIOGRAM N/A 11/15/2014   Procedure: LEFT HEART CATHETERIZATION WITH CORONARY ANGIOGRAM;  Surgeon: Troy Sine, MD;  Location: [facility] CATH LAB;  Service: Cardiovascular;  Laterality: N/A;   left knee replacement  01.2011   Patient Active Problem List   Diagnosis Date Noted   Malignant neoplasm of upper-outer quadrant of right breast in female, estrogen receptor positive ([place]) 12/03/2022   Effusion, right knee 06/12/2020   Genetic testing 12/24/2014   Family history of breast cancer     Family history of ovarian cancer    Coronary artery calcification seen on CAT scan 11/14/2014   SOB (shortness of breath) 11/14/2014   LBBB (left bundle branch block) 11/14/2014   Airway hyperreactivity 11/12/2014   Other disorders of bone development and growth, unspecified site 11/12/2014   Fibrositis 11/12/2014   Mild intermittent asthma without complication 73/53/2992   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[place]) 11/12/2014   Chest pain 10/29/2014   Vitamin C deficiency 10/17/2014   Iron deficiency anemia 10/17/2014   FH: BRCA gene positive 10/17/2014   Easy bruising 09/16/2014   History of anemia 09/16/2014   ESR raised 09/16/2014   Other malaise and fatigue 09/11/2013   Severe obesity (BMI >= 40) (HCC) 09/11/2013   GERD (gastroesophageal reflux disease) 07/18/2013   H/O fibromyalgia 07/18/2013   Encounter for preventive health examination 07/18/2013   Cubital tunnel syndrome on right 07/18/2013   Acute bronchitis 07/18/2013   Routine general medical examination at a health care facility 12/13/2012    REFERRING PROVIDER: Dr. Rolm Bookbinder  REFERRING DIAG: Right breast cancer  THERAPY DIAG:  Malignant neoplasm of upper-outer  quadrant of right breast in female, estrogen receptor positive ([place])  Abnormal posture  Rationale for Evaluation and Treatment: Rehabilitation  ONSET DATE: 11/10/2022  SUBJECTIVE:                                                                                                                                                                                           SUBJECTIVE STATEMENT: Patient reports she is here today to be seen by her medical team for her newly diagnosed right breast cancer.   PERTINENT HISTORY:  Patient was diagnosed on 11/10/2022 with right grade 2 invasive ductal carcinoma breast cancer. It measures 1.1 and 1 cm (2 masses) and is located in the upper outer quadrant. It is ER/PR positive and HER2 negative with a Ki67 of 5%. She had a gastic bypass  in 2004 and a left total knee replacement in 2000.   PATIENT GOALS:   reduce lymphedema risk and learn post op HEP.   PAIN:  Are you having pain? No  PRECAUTIONS: Active CA   HAND DOMINANCE: right  WEIGHT BEARING RESTRICTIONS: No  FALLS:  Has patient fallen in last 6 months? No  LIVING ENVIRONMENT: Patient lives with: her husband and 41 y.o. 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 and uses [employer]  PRIOR LEVEL OF FUNCTION: Independent   OBJECTIVE:  COGNITION: Overall cognitive status: Within functional limits for tasks assessed    POSTURE:  Forward head and rounded shoulders posture  UPPER EXTREMITY AROM/PROM:  A/PROM RIGHT   eval   Shoulder extension 50  Shoulder flexion 144  Shoulder abduction 140  Shoulder internal rotation 37  Shoulder external rotation 75    (Blank rows = not tested)  A/PROM LEFT   eval  Shoulder extension 51  Shoulder flexion 139  Shoulder abduction 153  Shoulder internal rotation 66  Shoulder external rotation 72    (Blank rows = not tested)  CERVICAL AROM: All within normal limits   UPPER EXTREMITY STRENGTH: WFL  LYMPHEDEMA ASSESSMENTS:   LANDMARK RIGHT   eval  10 cm proximal to olecranon process 38.8  Olecranon process 27.2  10 cm proximal to ulnar styloid process 24  Just proximal to ulnar styloid process 17.7  Across hand at thumb web space 18.9  At base of 2nd digit 6.8  (Blank rows = not tested)  LANDMARK LEFT   eval  10 cm proximal to olecranon process 38.8  Olecranon process 29.4  10 cm proximal to ulnar styloid process 23.1  Just proximal to ulnar styloid process 17.8  Across hand at  thumb web space 18.5  At base of 2nd digit 6.7  (Blank rows = not tested)  L-DEX LYMPHEDEMA SCREENING:  The patient was assessed using the L-Dex machine today to produce a lymphedema index baseline score. The patient will  be reassessed on a regular basis (typically every 3 months) to obtain new L-Dex scores. If the score is > 6.5 points away from his/her baseline score indicating onset of subclinical lymphedema, it will be recommended to wear a compression garment for 4 weeks, 12 hours per day and then be reassessed. If the score continues to be > 6.5 points from baseline at reassessment, we will initiate lymphedema treatment. Assessing in this manner has a 95% rate of preventing clinically significant lymphedema.   L-DEX FLOWSHEETS - 12/08/22 1100       L-DEX LYMPHEDEMA SCREENING   Measurement Type Unilateral    L-DEX MEASUREMENT EXTREMITY Upper Extremity    POSITION  Standing    DOMINANT SIDE Right    At Risk Side Right    BASELINE SCORE (UNILATERAL) -3.1             QUICK DASH SURVEY:  Katina Dung - 12/08/22 0001     Open a tight or new jar Mild difficulty    Do heavy household chores (wash walls, wash floors) Mild difficulty    Carry a shopping bag or briefcase No difficulty    Wash your back Mild difficulty    Use a knife to cut food No difficulty    Recreational activities in which you take some force or impact through your arm, shoulder, or hand (golf, hammering, tennis) Mild difficulty    During the past week, to what extent has your arm, shoulder or hand problem interfered with your normal social activities with family, friends, neighbors, or groups? Not at all    During the past week, to what extent has your arm, shoulder or hand problem limited your work or other regular daily activities Not at all    Arm, shoulder, or hand pain. None    Tingling (pins and needles) in your arm, shoulder, or hand None    Difficulty Sleeping No difficulty    DASH Score 9.09 %              PATIENT EDUCATION:  Education details: Lymphedema risk reduction and post op shoulder/posture HEP Person educated: Patient Education method: Explanation, Demonstration, Handout Education comprehension: Patient  verbalized understanding and returned demonstration  HOME EXERCISE PROGRAM: Patient was instructed today in a home exercise program today for post op shoulder range of motion. These included active assist shoulder flexion in sitting, scapular retraction, wall walking with shoulder abduction, and hands behind head external rotation.  She was encouraged to do these twice a day, holding 3 seconds and repeating 5 times when permitted by her physician.   ASSESSMENT:  CLINICAL IMPRESSION: Patient was diagnosed on 11/10/2022 with right grade 2 invasive ductal carcinoma breast cancer. It measures 1.1 and 1 cm (2 masses) and is located in the upper outer quadrant. It is ER/PR positive and HER2 negative with a Ki67 of 5%. She had a gastic bypass in 2004 and a left total knee replacement in 2000.  Her multidisciplinary medical team met prior to her assessments to determine a recommended treatment plan. She is planning to have a right lumpectomy and sentinel node biopsy followed by Oncotype testing, radiation, and anti-estrogen therapy. She will benefit from a post op PT reassessment to determine needs and from L-Dex screens every  3 months for 2 years to detect subclinical lymphedema.  Pt will benefit from skilled therapeutic intervention to improve on the following deficits: Decreased knowledge of precautions, impaired UE functional use, pain, decreased ROM, postural dysfunction.   PT treatment/interventions: ADL/self-care home management, pt/family education, therapeutic exercise  REHAB POTENTIAL: Excellent  CLINICAL DECISION MAKING: Stable/uncomplicated  EVALUATION COMPLEXITY: Low   GOALS: Goals reviewed with patient? YES  LONG TERM GOALS: (STG=LTG)    Name Target Date Goal status  1 Pt will be able to verbalize understanding of pertinent lymphedema risk reduction practices relevant to her dx specifically related to skin care.  Baseline:  No knowledge 12/08/2022 Achieved at eval  2 Pt will be able  to return demo and/or verbalize understanding of the post op HEP related to regaining shoulder ROM. Baseline:  No knowledge 12/08/2022 Achieved at eval  3 Pt will be able to verbalize understanding of the importance of attending the post op After Breast CA Class for further lymphedema risk reduction education and therapeutic exercise.  Baseline:  No knowledge 12/08/2022 Achieved at eval  4 Pt will demo she has regained full shoulder ROM and function post operatively compared to baselines.  Baseline: See objective measurements taken today. 02/02/2023     PLAN:  PT FREQUENCY/DURATION: EVAL and 1 follow up appointment.   PLAN FOR NEXT SESSION: will reassess 3-4 weeks post op to determine needs.   Patient will follow up at outpatient cancer rehab 3-4 weeks following surgery.  If the patient requires physical therapy at that time, a specific plan will be dictated and sent to the referring physician for approval. The patient was educated today on appropriate basic range of motion exercises to begin post operatively and the importance of attending the After Breast Cancer class following surgery.  Patient was educated today on lymphedema risk reduction practices as it pertains to recommendations that will benefit the patient immediately following surgery.  She verbalized good understanding.    Physical Therapy Information for After Breast Cancer Surgery/Treatment:  Lymphedema is a swelling condition that you may be at risk for in your arm if you have lymph nodes removed from the armpit area.  After a sentinel node biopsy, the risk is approximately 5-9% and is higher after an axillary node dissection.  There is treatment available for this condition and it is not life-threatening.  Contact your physician or physical therapist with concerns. You may begin the 4 shoulder/posture exercises (see additional sheet) when permitted by your physician (typically a week after surgery).  If you have drains, you may need  to wait until those are removed before beginning range of motion exercises.  A general recommendation is to not lift your arms above shoulder height until drains are removed.  These exercises should be done to your tolerance and gently.  This is not a "no pain/no gain" type of recovery so listen to your body and stretch into the range of motion that you can tolerate, stopping if you have pain.  If you are having immediate reconstruction, ask your plastic surgeon about doing exercises as he or she may want you to wait. We encourage you to attend the free one time ABC (After Breast Cancer) class offered by [facility].  You will learn information related to lymphedema risk, prevention and treatment and additional exercises to regain mobility following surgery.  You can call [phone] for more information.  This is offered the 1st and 3rd Monday of each month.  You only attend  the class one time. While undergoing any medical procedure or treatment, try to avoid blood pressure being taken or needle sticks from occurring on the arm on the side of cancer.   This recommendation begins after surgery and continues for the rest of your life.  This may help reduce your risk of getting lymphedema (swelling in your arm). An excellent resource for those seeking information on lymphedema is the National Lymphedema Network's web site. It can be accessed at [place].org If you notice swelling in your hand, arm or breast at any time following surgery (even if it is many years from now), please contact your doctor or physical therapist to discuss this.  Lymphedema can be treated at any time but it is easier for you if it is treated early on.  If you feel like your shoulder motion is not returning to normal in a reasonable amount of time, please contact your surgeon or physical therapist.  [facility] [phone]. [address], [address], [place]  [address]  ABC CLASS After Breast Cancer Class  After Breast Cancer Class is a specially designed exercise class to assist you in a safe recover after having breast cancer surgery.  In this class you will learn how to get back to full function whether your drains were just removed or if you had surgery a month ago.  This one-time class is held the 1st and 3rd Monday of every month from 11:00 a.m. until 12:00 noon virtually.  This class is FREE and space is limited. For more information or to register for the next available class, call [phone].  Class Goals  Understand specific stretches to improve the flexibility of you chest and shoulder. Learn ways to safely strengthen your upper body and improve your posture. Understand the warning signs of infection and why you may be at risk for an arm infection. Learn about Lymphedema and prevention.  ** You do not attend this class until after surgery.  Drains must be removed to participate  Patient was instructed today in a home exercise program today for post op shoulder range of motion. These included active assist shoulder flexion in sitting, scapular retraction, wall walking with shoulder abduction, and hands behind head external rotation.  She was encouraged to do these twice a day, holding 3 seconds and repeating 5 times when permitted by her physician.  Annia Friendly, [place] 12/08/22 11:21 AM

## 2022-12-08 NOTE — Research (Signed)
[employer] 2021-05 - Specimen Collection Study to Evaluate Biomarkers in Subjects with Cancer    This Nurse has reviewed this patient's inclusion and exclusion criteria as a second review and confirms Karla [name] is eligible for study participation.  Patient may continue with enrollment.  Marjie Skiff Winda Summerall, RN, BSN, [facility] She  Her  Hers Clinical Research Nurse [facility] Direct Dial [phone]  Pager [phone] 12/08/2022 12:01 PM

## 2022-12-08 NOTE — Research (Addendum)
[employer] 2021-05 - Specimen Collection Study to Evaluate Biomarkers in Subjects with Cancer    Patient Karla [name] was identified by Dr. Chryl Heck as a potential candidate for the above listed study.  This Clinical Research Coordinator met with Karla [name], Karla [name], on 12/08/22 in a manner and location that ensures patient privacy to discuss participation in the above listed research study.  Patient is Accompanied by her son .  A copy of the informed consent document with embedded HIPAA language was provided to the patient.  Patient reads, speaks, and understands [place].    Patient was provided with the business card of this Coordinator and encouraged to contact the research team with any questions.  Patient was provided the option of taking informed consent documents home to review and was encouraged to review at their convenience with their support network, including other care providers. Patient is comfortable with making a decision regarding study participation today.  As outlined in the informed consent form, this Coordinator and Karla [name] discussed the purpose of the research study, the investigational nature of the study, study procedures and requirements for study participation, potential risks and benefits of study participation, as well as alternatives to participation. This study is not blinded. The patient understands participation is voluntary and they may withdraw from study participation at any time.  This study does not involve randomization.  This study does not involve an investigational drug or device. This study does not involve a placebo. Patient understands enrollment is pending full eligibility review.   Confidentiality and how the patient's information will be used as part of study participation were discussed.  Patient was informed there is not reimbursement provided for their time and effort spent on trial participation.  The patient is  encouraged to discuss research study participation with their insurance provider to determine what costs they may incur as part of study participation, including research related injury.    All questions were answered to patient's satisfaction.  The informed consent with embedded HIPAA language was reviewed page by page.  The patient's mental and emotional status is appropriate to provide informed consent, and the patient verbalizes an understanding of study participation.  Patient [name] agreed to participate in the above listed research study and [name] voluntarily signed the informed consent [employer] Approved Version 19 Dec 2020 with embedded HIPAA language, version [employer] Approved Version 19 Dec 2020  on 12/08/22 at 12:00PM.  The patient was provided with a copy of the signed informed consent form with embedded HIPAA language for their reference.  No study specific procedures were obtained prior to the signing of the informed consent document.  Approximately 30 minutes were spent with the patient reviewing the informed consent documents.  Patient was not requested to complete a Release of Information form.    This Coordinator [name] reviewed this patient's inclusion and exclusion criteria and confirmed Karla [name] is eligible for study participation.  Patient will continue with enrollment.  Menopausal status (women only): Karla [name] had a hysterectomy, but is likely postmenopasual.   Eligibility confirmed by treating investigator, who also agrees that patient should proceed with enrollment.   Medical History:  High Blood Pressure  No Coronary Artery Disease No Lupus    No Rheumatoid Arthritis  No Diabetes   No      If yes, which type?      N/A Lynch Syndrome  No  Is the patient currently taking a magnesium  supplement?   No If yes, dose and frequency? N/A Indication? N/A Start date? N/A  Does the patient have a personal history of cancer (greater than 5 years ago)?   No If yes, Cancer type and date of diagnosis?   N/A  [name] this previous diagnosis been treated? N/A      If so, treatment type? N/A   Start and end dates of last treatment cycle? N/A  Does the patient have a family history of cancer in 1st or 2nd degree relatives? Yes If yes, Relationship(s) and Cancer type(s)? Mother- Esophagus, Aunt- Breast   Does the patient have history of alcohol consumption? No   If yes, current or former? N/A If former, year stopped? N/A  Number of years? N/A Drinks per week? N/A  Does the patient have history of cigarette, cigar, pipe, or chewing tobacco use?  No  If yes, current for former? N/A If yes, type (Cigarette, cigar, pipe, and/or chewing tobacco)? N/A   If former, year stopped? N/A Number of years? N/A Packs/number/containers per day? N/A   Patient was taken to the lab to complete her blood collection for the study.   After a successful blood collection the patient was given Gift Card by Karla [name], Research Assistant    Patient was thanked for their time and participation. Patient's involvement in the study [name] been completed and research coordinator will request patient's tumor block and enter the required data.   Karla [name], [facility] 12/08/2022 12:56 PM

## 2022-12-08 NOTE — Progress Notes (Signed)
REFERRING PROVIDER: Benay Pike, MD  PRIMARY PROVIDER:  Shirline Frees, MD  PRIMARY REASON FOR VISIT:  1. Malignant neoplasm of upper-outer quadrant of right breast in female, estrogen receptor positive ([place])   2. Family history of ovarian cancer   3. Family history of breast cancer     HISTORY OF PRESENT ILLNESS:   [name], a 62 y.o. female, was seen for a [place] cancer genetics consultation at the request of Dr. Chryl Heck due to a personal and family history of cancer.  [name] presents to clinic today to discuss the possibility of a hereditary predisposition to cancer, to discuss genetic testing, and to further clarify her future cancer risks, as well as potential cancer risks for family members.   In December 2023, at the age of 76, 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 She had Negative [facility] OvaNext genetic testing in 2015 (24 genes).   CANCER HISTORY:  Oncology History  Malignant neoplasm of upper-outer quadrant of right breast in female, estrogen receptor positive ([place])  11/10/2022 Mammogram   Screening mammogram showed focal asymmetry in the right breast at 12 o clock middle depth is indeterminate.  Mass in the right breast upper outer posterior depth is indeterminate. Diagnostic mammogram  confirmed 1.1 cm irregular mass in the right breast at 10 clock post depth is indeterminate. [place] recommended. 0.8 cm oval mass in the right breast 12 0 clock depht, indeterminate. [place] recommended   11/18/2022 Breast US   Bilateral breast ultrasound confirmed the above-mentioned findings.    12/03/2022 Initial Diagnosis   Malignant neoplasm of upper-outer quadrant of right breast in female, estrogen receptor positive [facility])    Pathology Results   Right breast needle core biopsy at 1:00 6 cm from nipple showed grade 2 IDC ER/PR 100% strong positive, Ki-67 of 5% and HER2 negative.  Right breast  needle core biopsy at 10:00 8 to 12 cm from the right nipple showed mucocele like lesion.  Left breast needle core biopsy at 2:00 showed fibrocystic changes, negative for malignancy.      RISK FACTORS:  Menarche was at age 32.  First live birth at age 35.  OCP use for approximately 0 years.  Ovaries intact: yes.  Uterus intact: no.  Menopausal status: postmenopausal.  HRT use: 0 years. Colonoscopy: yes;  2000 . Mammogram within the last year: yes. Any excessive radiation exposure in the past: no  Past Medical History:  Diagnosis Date   Allergy    SEASONAL   Anemia    Asthma    Avascular necrosis ([place])    knee,left   Avascular necrosis ([place])    LEFT KNEE   Blood transfusion without reported diagnosis    Chicken pox    Childhood asthma    Easy bruising    Eczema    hands   ESR raised    ESR raised    Family history of ovarian cancer    Fibromyalgia    GERD (gastroesophageal reflux disease)    H/O gastric bypass    History of anemia    Iron deficiency    Kidney stone    Nephrolithiasis    Osteopenia    Tubular adenoma of colon    Ulcer    2004   Vitamin C deficiency    Vitamin D deficiency     Past Surgical History:  Procedure Laterality Date   ABDOMINAL HYSTERECTOMY  2000   w/opph  APPENDECTOMY     BREAST BIOPSY Right 1997   CHOLECYSTECTOMY  2003   DENTAL SURGERY     All Upper Removed   GASTRIC BYPASS  2003   LEFT HEART CATHETERIZATION WITH CORONARY ANGIOGRAM N/A 11/15/2014   Procedure: LEFT HEART CATHETERIZATION WITH CORONARY ANGIOGRAM;  Surgeon: Troy Sine, MD;  Location: [facility] CATH LAB;  Service: Cardiovascular;  Laterality: N/A;   left knee replacement  01.2011    Social History   Socioeconomic History   Marital status: Married    Spouse name: Not on file   Number of children: Not on file   Years of education: Not on file   Highest education level: Not on file  Occupational History   Not on file  Tobacco Use   Smoking status: Former     Types: Cigarettes    Quit date: 02/15/2012    Years since quitting: 10.8   Smokeless tobacco: Never   Tobacco comments:    quit 09/2010  Substance and Sexual Activity   Alcohol use: No   Drug use: No   Sexual activity: Yes    Birth control/protection: Surgical  Other Topics Concern   Not on file  Social History Narrative   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 Patient is on disability trying to maintain it. She won a cutody battle for a 51 year old grand daughter.   Social Determinants of Health   Financial Resource Strain: Low Risk  (12/08/2022)   Overall Financial Resource Strain (CARDIA)    Difficulty of Paying Living Expenses: Not hard at all  Food Insecurity: No Food Insecurity (12/08/2022)   Hunger Vital Sign    Worried About Running Out of Food in the Last Year: Never true    Ran Out of Food in the Last Year: Never true  Transportation Needs: No Transportation Needs (12/08/2022)   PRAPARE - Hydrologist (Medical): No    Lack of Transportation (Non-Medical): No  Physical Activity: Not on file  Stress: Not on file  Social Connections: Not on file     FAMILY HISTORY:  We obtained a detailed, 4-generation family history.  Significant diagnoses are listed below: Family History  Problem Relation Age of Onset   Asthma Mother        Deceased   Other Mother 58       Ruptured Bowel   GER disease Mother    Coronary artery disease Mother    Rashes / Skin problems Mother    Heart disease Mother    Heart attack Mother    Esophageal cancer Mother 70 - 55       smoked   Diabetes Father        Deceased   Heart attack Father    Coronary artery disease Father    Heart disease Father    Ovarian cancer Sister 25       maternal half-sister; "gene positive"   Healthy Brother    Ovarian cancer Maternal Aunt 32   Cancer - Other Maternal Aunt 67 - 51       "female  cancer"   Breast cancer Maternal Aunt 31   Throat cancer Maternal Grandmother    Lung cancer Maternal Grandmother 51   Lung cancer Maternal Grandfather    Cancer - Other Paternal Grandmother        "  female cancer"   Arthritis/Rheumatoid Daughter    Rashes / Skin problems Daughter        psoriasis   Psoriasis Daughter        Ms. Talley Inman's maternal half-sister was diagnosed with ovarian cancer at age 41. She reportedly had positive genetic testing but Ms. Liz Malady cannot obtain the results. Ms. Kashish Yglesias has three maternal aunts. One was diagnosed with breast cancer at age 73, one was diagnosed with ovarian cancer at age 62, and the third aunt was diagnosed with a "female cancer" at an unknown age. Ms. Vanecia Limpert mother was diagnosed with esophageal cancer in her 51s, she smoked and died at age 61. Her maternal grandmother was diagnosed with esophageal cancer and lung cancer, she died in her 36s. Her maternal great grandmother (grandmother's mother) was diagnosed with breast cancer, she is deceased. Ms. Aram Candela Inman's paternal grandmother was diagnosed with a "female cancer" at an unknown age, she died in her 60s. There is no reported Ashkenazi Jewish ancestry.  GENETIC COUNSELING ASSESSMENT: Ms. Iysha Mishkin is a 62 y.o. female with a personal and family history of cancer which is somewhat suggestive of a hereditary predisposition to cancer. We, therefore, discussed and recommended the following at today's visit.   DISCUSSION: We discussed that 5 - 10% of cancer is hereditary, with most cases of breast cancer associated with BRCA1/2.  There are other genes that can be associated with hereditary breast cancer syndromes.  We discussed that testing is beneficial for several reasons including knowing how to follow individuals after completing their treatment, identifying whether potential treatment options would be beneficial, and understanding if other family members could be at risk  for cancer and allowing them to undergo genetic testing.   We reviewed the characteristics, features and inheritance patterns of hereditary cancer syndromes. We also discussed genetic testing, including the appropriate family members to test, the process of testing, insurance coverage and turn-around-time for results. We discussed the implications of a negative, positive, carrier and/or variant of uncertain significant result.   We discussed her negative genetic testing performed in 2015 and the low yield for updated genetic testing considering she had a 24 gene panel. Ms. Paradise Vensel still wanted to pursue updated testing for the benefit of her daughter and granddaughter. Ms. Anberlin Diez elected to have [facility] CancerNext-Expanded Panel. We will not STAT the genetic testing, because she said that the results would not impact her surgical decision.   The CancerNext-Expanded gene panel offered by [facility] and includes sequencing, rearrangement, and RNA analysis for the following 77 genes: AIP, ALK, APC, ATM, AXIN2, BAP1, BARD1, BLM, BMPR1A, BRCA1, BRCA2, BRIP1, CDC73, CDH1, CDK4, CDKN1B, CDKN2A, CHEK2, CTNNA1, DICER1, FANCC, FH, FLCN, GALNT12, KIF1B, LZTR1, MAX, MEN1, MET, MLH1, MSH2, MSH3, MSH6, MUTYH, NBN, NF1, NF2, NTHL1, PALB2, PHOX2B, PMS2, POT1, PRKAR1A, PTCH1, PTEN, RAD51C, RAD51D, RB1, RECQL, RET, SDHA, SDHAF2, SDHB, SDHC, SDHD, SMAD4, SMARCA4, SMARCB1, SMARCE1, STK11, SUFU, TMEM127, TP53, TSC1, TSC2, VHL and XRCC2 (sequencing and deletion/duplication); EGFR, EGLN1, HOXB13, KIT, MITF, PDGFRA, POLD1, and POLE (sequencing only); EPCAM and GREM1 (deletion/duplication only).   Based on Ms. Talley Inman's personal and family history of cancer, she meets medical criteria for genetic testing. Despite that she meets criteria, she may still have an out of pocket cost. We discussed that if her out of pocket cost for testing is over $100, the laboratory will call and confirm whether she wants to proceed  with testing.  If the out of pocket cost of  testing is less than $100 she will be billed by the genetic testing laboratory.   PLAN: After considering the risks, benefits, and limitations, Ms. Liz Malady provided informed consent to pursue genetic testing and the blood sample was sent to [facility] for analysis of the CancerNext-Expanded Panel. Results should be available within approximately 2-3 weeks' time, at which point they will be disclosed by telephone to Ms. Liz Malady, as will any additional recommendations warranted by these results. Ms. Ananiah Maciolek will receive a summary of her genetic counseling visit and a copy of her results once available. This information will also be available in Epic.   Ms. Aislynn Cifelli questions were answered to her satisfaction today. Our contact information was provided should additional questions or concerns arise. Thank you for the referral and allowing [place] to share in the care of your patient.   Lucille Passy, MS, [facility] Genetic Counselor [email or web address] (P) [phone]  The patient was seen for a total of 20 minutes in face-to-face genetic counseling.  The patient brought her husband.  Drs. Lindi Adie and/or Burr Medico were available to discuss this case as needed.   _______________________________________________________________________ For Office Staff:  Number of people involved in session: 2 Was an Intern/ student involved with case: no

## 2022-12-08 NOTE — Progress Notes (Signed)
[facility] Clinical Social Work  Initial Assessment   Karla [name] is a 62 y.o. year old female accompanied by spouse. Clinical Social Work was referred by  [facility]  for assessment of psychosocial needs.   SDOH (Social Determinants of Health) assessments performed: Yes SDOH Interventions    Flowsheet Row Clinical Support from 12/08/2022 in [facility]  SDOH Interventions   Food Insecurity Interventions Intervention Not Indicated  Housing Interventions Intervention Not Indicated  Transportation Interventions Intervention Not Indicated  Utilities Interventions Intervention Not Indicated  Financial Strain Interventions Intervention Not Indicated       SDOH Screenings   Food Insecurity: No Food Insecurity (12/08/2022)  Housing: Low Risk  (12/08/2022)  Transportation Needs: No Transportation Needs (12/08/2022)  Utilities: Not At Risk (12/08/2022)  Financial Resource Strain: Low Risk  (12/08/2022)  Tobacco Use: Medium Risk (12/08/2022)     Distress Screen completed: Yes    12/08/2022   11:45 AM  ONCBCN DISTRESS SCREENING  Screening Type Initial Screening  Distress experienced in past week (1-10) 6  Information Concerns Type Lack of info about diagnosis;Lack of info about treatment  Physical Problem type Constipation/diarrhea      Family/Social Information:  Housing Arrangement: patient lives with spouse, Sedalia Muta, and 18yo granddaughter , and dog Family members/support persons in your life? Spouse, daughter, granddaughter, Metallurgist concerns: no  Employment: Retired.  Income source: Husband's employment Financial concerns: No Type of concern: None Food access concerns: no Religious or spiritual practice: Not known Services Currently in place:    Coping/ Adjustment to diagnosis: Patient understands treatment plan and what happens next? yes, feels much better after receiving information today Concerns about diagnosis and/or treatment:  was  worried about the unknown and lack of information Patient enjoys time with family/ friends and shopping, her dog Current coping skills/ strengths: Motivation for treatment/growth  and Supportive family/friends     SUMMARY: Current SDOH Barriers:  No major barriers noted today  Clinical Social Work Clinical Goal(s):  No clinical social work goals at this time  Interventions: Discussed common feeling and emotions when being diagnosed with cancer, and the importance of support during treatment Informed patient of the support team roles and support services at [facility] Provided [place] contact information and encouraged patient to call with any questions or concerns   Follow Up Plan: Patient will contact CSW with any support or resource needs Patient verbalizes understanding of plan: Yes    Adorian Gwynne E Kizer Nobbe, LCSW

## 2022-12-08 NOTE — Progress Notes (Signed)
[facility] NOTE  Patient Care Team: Shirline Frees, MD as PCP - General (Family Medicine) Mauro Kaufmann, RN as Oncology Nurse Navigator Rockwell Germany, RN as Oncology Nurse Navigator Rolm Bookbinder, MD as Consulting Physician (General Surgery) Benay Pike, MD as Consulting Physician (Hematology and Oncology) Kyung Rudd, MD as Consulting Physician (Radiation Oncology)  CHIEF COMPLAINTS/PURPOSE OF CONSULTATION:  Newly diagnosed breast cancer  HISTORY OF PRESENTING ILLNESS:  Karla [name] 62 y.o. female is here because of recent diagnosis of right   I reviewed her records extensively and collaborated the history with the patient.  SUMMARY OF ONCOLOGIC HISTORY: Oncology History  Malignant neoplasm of upper-outer quadrant of right breast in female, estrogen receptor positive ([place])  11/10/2022 Mammogram   Screening mammogram showed focal asymmetry in the right breast at 12 o clock middle depth is indeterminate.  Mass in the right breast upper outer posterior depth is indeterminate. Diagnostic mammogram  confirmed 1.1 cm irregular mass in the right breast at 10 clock post depth is indeterminate. [place] recommended. 0.8 cm oval mass in the right breast 12 0 clock depht, indeterminate. [place] recommended   11/18/2022 Breast US   Bilateral breast ultrasound confirmed the above-mentioned findings.    12/03/2022 Initial Diagnosis   Malignant neoplasm of upper-outer quadrant of right breast in female, estrogen receptor positive [facility])    Pathology Results   Right breast needle core biopsy at 1:00 6 cm from nipple showed grade 2 IDC ER/PR 100% strong positive, Ki-67 of 5% and HER2 negative.  Right breast needle core biopsy at 10:00 8 to 12 cm from the right nipple showed mucocele like lesion.  Left breast needle core biopsy at 2:00 showed fibrocystic changes, negative for malignancy.     Patient arrived to the appointment today with her husband.  At baseline  she is healthy except for history of endometriosis and required hysterectomy back in 2000.  She does not take any medications at baseline.  She has 1 child.  She is retired from the [employer], used to work in the [employer].  She denies using any hormone replacement therapy or birth control.  Family history significant for breast cancer in maternal aunt.  Maternal aunt also had ovarian cancer in her 6s.  Mom had a esophageal cancer.  She is interested in genetic testing.  Rest of the pertinent 10 point ROS reviewed and negative.  MEDICAL HISTORY:  Past Medical History:  Diagnosis Date   Allergy    SEASONAL   Anemia    Asthma    Avascular necrosis ([place])    knee,left   Avascular necrosis ([place])    LEFT KNEE   Blood transfusion without reported diagnosis    Chicken pox    Childhood asthma    Easy bruising    Eczema    hands   ESR raised    ESR raised    Family history of ovarian cancer    Fibromyalgia    GERD (gastroesophageal reflux disease)    H/O gastric bypass    History of anemia    Iron deficiency    Kidney stone    Nephrolithiasis    Osteopenia    Tubular adenoma of colon    Ulcer    2004   Vitamin C deficiency    Vitamin D deficiency     SURGICAL HISTORY: Past Surgical History:  Procedure Laterality Date   ABDOMINAL HYSTERECTOMY  2000   w/opph   APPENDECTOMY  BREAST BIOPSY Right 1997   CHOLECYSTECTOMY  2003   DENTAL SURGERY     All Upper Removed   GASTRIC BYPASS  2003   LEFT HEART CATHETERIZATION WITH CORONARY ANGIOGRAM N/A 11/15/2014   Procedure: LEFT HEART CATHETERIZATION WITH CORONARY ANGIOGRAM;  Surgeon: Troy Sine, MD;  Location: [facility] CATH LAB;  Service: Cardiovascular;  Laterality: N/A;   left knee replacement  01.2011    SOCIAL HISTORY: Social History   Socioeconomic History   Marital status: Married    Spouse name: Not on file   Number of children: Not on file   Years of education: Not on file   Highest education level: Not  on file  Occupational History   Not on file  Tobacco Use   Smoking status: Former    Types: Cigarettes    Quit date: 02/15/2012    Years since quitting: 10.8   Smokeless tobacco: Never   Tobacco comments:    quit 09/2010  Substance and Sexual Activity   Alcohol use: No   Drug use: No   Sexual activity: Yes    Birth control/protection: Surgical  Other Topics Concern   Not on file  Social History Narrative   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 Patient is on disability trying to maintain it. She won a cutody battle for a 71 year old grand daughter.   Social Determinants of Health   Financial Resource Strain: Not on file  Food Insecurity: Not on file  Transportation Needs: Not on file  Physical Activity: Not on file  Stress: Not on file  Social Connections: Not on file  Intimate Partner Violence: Not on file    FAMILY HISTORY: Family History  Problem Relation Age of Onset   Asthma Mother        Deceased   Cancer - Other Mother        Esophageal 37s; deceased 9; smoker   Other Mother 86       Ruptured Bowel   GER disease Mother    Coronary artery disease Mother    Rashes / Skin problems Mother    Heart disease Mother    Heart attack Mother    Diabetes Father        Deceased   Heart attack Father    Coronary artery disease Father    Heart disease Father    Throat cancer Maternal Grandmother        Deceased 16s   Cancer - Other Maternal Grandmother        throat; deceased 40s   Ovarian cancer Maternal Aunt        Deceased late 66s   Healthy Brother    Arthritis/Rheumatoid Daughter    Rashes / Skin problems Daughter        psoriasis   Psoriasis Daughter    Ovarian cancer Sister        58yo; maternal half-sister; "gene positive"   Cancer - Other Paternal Grandmother        "female cancer"; deceased 11s   Cancer - Other Maternal Aunt        "female cancer" 27s     ALLERGIES:  is allergic to sodium pentobarbital [pentobarbital] and other.  MEDICATIONS:  Current Outpatient Medications  Medication Sig Dispense Refill   Multiple Vitamin (MULTIVITAMIN) capsule Take 1 capsule by mouth daily.     albuterol (VENTOLIN  HFA) 108 (90 Base) MCG/ACT inhaler Inhale 2 puffs into the lungs every 6 (six) hours as needed for wheezing or shortness of breath (Cough). (Patient not taking: Reported on 12/08/2022) 18 g 0   Ascorbic Acid (VITAMIN C) 1000 MG tablet Take 1,000 mg by mouth daily. (Patient not taking: Reported on 12/08/2022)     aspirin EC 81 MG tablet Take 1 tablet (81 mg total) by mouth daily. 90 tablet 3   fexofenadine-pseudoephedrine (ALLEGRA-D 24) 180-240 MG 24 hr tablet Take 1 tablet by mouth as needed (SEASONAL ALLERGIES).  (Patient not taking: Reported on 12/08/2022)     fluticasone (FLONASE) 50 MCG/ACT nasal spray Place 2 sprays into both nostrils daily. 16 g 2   pantoprazole (PROTONIX) 40 MG tablet Take 40 mg by mouth daily.     predniSONE (DELTASONE) 50 MG tablet Take 1 tablet (50 mg total) by mouth daily with breakfast. 5 tablet 0   promethazine-dextromethorphan (PROMETHAZINE-DM) 6.25-15 MG/5ML syrup Take 5 mLs by mouth 3 (three) times daily as needed for cough. 100 mL 0   No current facility-administered medications for this visit.    REVIEW OF SYSTEMS:   Constitutional: Denies fevers, chills or abnormal night sweats Eyes: Denies blurriness of vision, double vision or watery eyes Ears, nose, mouth, throat, and face: Denies mucositis or sore throat Respiratory: Denies cough, dyspnea or wheezes Cardiovascular: Denies palpitation, chest discomfort or lower extremity swelling Gastrointestinal:  Denies nausea, heartburn or change in bowel habits Skin: Denies abnormal skin rashes Lymphatics: Denies new lymphadenopathy or easy bruising Neurological:Denies numbness, tingling or new weaknesses Behavioral/Psych: Mood is stable, no new changes  Breast:  Denies any palpable lumps or discharge All other systems were reviewed with the patient and are negative.  PHYSICAL EXAMINATION: ECOG PERFORMANCE STATUS: 0 - Asymptomatic  Vitals:   12/08/22 0827  BP: (!) 166/85  Pulse: 64  Resp: 16  Temp: 97.6 F (36.4 C)  SpO2: 100%   Filed Weights   12/08/22 0827  Weight: 242 lb 1.6 oz (109.8 kg)    GENERAL:alert, no distress and comfortable SKIN: skin color, texture, turgor are normal, no rashes or significant lesions EYES: normal, conjunctiva are pink and non-injected, sclera clear OROPHARYNX:no exudate, no erythema and lips, buccal mucosa, and tongue normal  NECK: supple, thyroid normal size, non-tender, without nodularity LYMPH:  no palpable lymphadenopathy in the cervical, axillary LUNGS: clear to auscultation and percussion with normal breathing effort HEART: regular rate & rhythm and no murmurs and no lower extremity edema ABDOMEN:abdomen soft, non-tender and normal bowel sounds Musculoskeletal:no cyanosis of digits and no clubbing  PSYCH: alert & oriented x 3 with fluent speech NEURO: no focal motor/sensory deficits BREAST: No obvious palpable masses in bilateral breast.  Postbiopsy changes noted in the right breast.  No palpable regional adenopathy.   LABORATORY DATA:  I have reviewed the data as listed Lab Results  Component Value Date   WBC 10.4 12/08/2022   HGB 13.3 12/08/2022   HCT 39.9 12/08/2022   MCV 86.0 12/08/2022   PLT 260 12/08/2022   Lab Results  Component Value Date   NA 139 12/08/2022   K 4.2 12/08/2022   CL 107 12/08/2022   CO2 27 12/08/2022    RADIOGRAPHIC STUDIES: I have personally reviewed the radiological reports and agreed with the findings in the report.  ASSESSMENT AND PLAN:  Malignant neoplasm of upper-outer quadrant of right breast in female, estrogen receptor positive ([place]) This is a very pleasant 62 year old likely postmenopausal, had hysterectomy 20 years  ago referred to breast [place] with  new diagnosis of right breast grade 2 IDC, ER/PR 100% strong staining, Ki-67 of 5%, HER2 negative.  She also has a mucocele in the right breast at 10:00 which will be excised.  We have reviewed her findings including imaging and pathology in the breast [place].  Dr. Donne Hazel will discuss with her about the utility of MRI in the setting.  At this time since given strong ER/PR positivity and small tumor, we have discussed about upfront surgery followed by consideration for Oncotype testing.  We have discussed about Oncotype Dx score which is a well validated prognostic scoring system which can predict outcome with endocrine therapy alone and whether chemotherapy reduces recurrence.  Typically in patients with ER positive cancers that are node negative if the RS score is high typically greater than or equal to 26, chemotherapy is recommended.  In women with intermediate recurrence score younger than 33, there can still be some role for chemotherapy in addition to endocrine therapy especially if the recurrence score is between 21-25. If chemotherapy is needed, this will precede radiation and then after radiation she will continue on antiestrogen therapy.  She is less likely to need adjuvant chemotherapy based on pathology so far.  She will return to clinic after surgery to review final pathology and additional recommendations.  We have also discussed about antiestrogen therapy in detail today.  We have discussed options for antiestrogen therapy today  With regards to Tamoxifen, we discussed that this is a SERM, selective estrogen receptor modulator. We discussed mechanism of action of Tamoxifen, adverse effects on Tamoxifen including but not limited to post menopausal symptoms, increased risk of DVT/PE, increased risk of endometrial cancer, questionable cataracts with long term use and increased risk of cardiovascular events in the study which was not statistically significant. A benefit from Tamoxifen would be  improvement in bone density. With regards to aromatase inhibitors, we discussed mechanism of action, adverse effects including but not limited to post menopausal symptoms, arthralgias, myalgias, increased risk of cardiovascular events and bone loss.  Tamoxifen can be used in premenopausal and post menopausal women. Aromatase inhibitors can only be used in premenopausal women along with OFS.   She is currently interested in pursuing aromatase inhibitors.  She will return to clinic after surgery and radiation to initiate antiestrogen therapy. All her questions were answered to the best my knowledge.  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  Thank you for consulting [place] in the care of this patient.  Please do not hesitate to contact us with any additional questions or concerns.  Total time spent: 60 minutes including history, physical exam, review of records, counseling and coordination of care All questions were answered. The patient knows to call the clinic with any problems, questions or concerns.    Benay Pike, MD 12/08/22

## 2022-12-10 ENCOUNTER — Encounter: Payer: Self-pay | Admitting: *Deleted

## 2022-12-10 ENCOUNTER — Telehealth: Payer: Self-pay | Admitting: *Deleted

## 2022-12-10 DIAGNOSIS — J069 Acute upper respiratory infection, unspecified: Secondary | ICD-10-CM | POA: Diagnosis not present

## 2022-12-10 DIAGNOSIS — R0989 Other specified symptoms and signs involving the circulatory and respiratory systems: Secondary | ICD-10-CM | POA: Diagnosis not present

## 2022-12-10 NOTE — Telephone Encounter (Signed)
Spoke to pt concerning [place] from 12/08/22. Denies questions or concerns regarding dx or treatment care plan. Confirmed further dates. Pt discussed special form needed for insurance.  Forwarded pt email to both [facility] and [facility] to review. Encourage pt to call with needs. Received verbal understanding.

## 2022-12-17 DIAGNOSIS — C50911 Malignant neoplasm of unspecified site of right female breast: Secondary | ICD-10-CM | POA: Diagnosis not present

## 2022-12-20 NOTE — Pre-Procedure Instructions (Signed)
Surgical Instructions    Your procedure is scheduled on Thursday, January 18th.  Report to [facility] Main Entrance "A" at 11:15 A.M., then check in with the Admitting office.  Call this number if you have problems the morning of surgery:  [phone]  If you have any questions prior to your surgery date call [phone]: Open Monday-Friday 8am-4pm If you experience any cold or flu symptoms such as cough, fever, chills, shortness of breath, etc. between now and your scheduled surgery, please notify us at the above number.     Remember:  Do not eat after midnight the night before your surgery  You may drink clear liquids until 10:15 AM the morning of your surgery.   Clear liquids allowed are: Water, Non-Citrus Juices (without pulp), Carbonated Beverages, Clear Tea, Black Coffee Only (NO MILK, CREAM OR POWDERED CREAMER of any kind), and Gatorade.   Patient Instructions  The night before surgery:  No food after midnight. ONLY clear liquids after midnight  The day of surgery (if you do NOT have diabetes):  Drink ONE (1) Pre-Surgery Clear Ensure by 10:15 AM the morning of surgery. Drink in one sitting. Do not sip.  This drink was given to you during your hospital  pre-op appointment visit.  Nothing else to drink after completing the  Pre-Surgery Clear Ensure.          If you have questions, please contact your surgeon's office.     Take these medicines the morning of surgery with A SIP OF WATER   If needed: acetaminophen (TYLENOL)  albuterol (VENTOLIN HFA)- if needed, bring with you on day of surgery  fexofenadine (ALLEGRA)    As of today, STOP taking any Aspirin (unless otherwise instructed by your surgeon) Aleve, Naproxen, Ibuprofen, Motrin, Advil, Goody's, BC's, all herbal medications, fish oil, and all vitamins.                     Do NOT Smoke (Tobacco/Vaping) for 24 hours prior to your procedure.  If you use a CPAP at night, you may bring your mask/headgear for  your overnight stay.   Contacts, glasses, piercing's, hearing aid's, dentures or partials may not be worn into surgery, please bring cases for these belongings.    For patients admitted to the hospital, discharge time will be determined by your treatment team.   Patients discharged the day of surgery will not be allowed to drive home, and someone needs to stay with them for 24 hours.  SURGICAL WAITING ROOM VISITATION Patients having surgery or a procedure may have no more than 2 support people in the waiting area - these visitors may rotate.   Children under the age of 71 must have an adult with them who is not the patient. If the patient needs to stay at the hospital during part of their recovery, the visitor guidelines for inpatient rooms apply. Pre-op nurse will coordinate an appropriate time for 1 support person to accompany patient in pre-op.  This support person may not rotate.   Please refer to the [facility] website for the visitor guidelines for Inpatients (after your surgery is over and you are in a regular room).    Special instructions:   [facility]- Preparing For Surgery  Before surgery, you can play an important role. Because skin is not sterile, your skin needs to be as free of germs as possible. You can reduce the number of germs on your skin by washing with CHG (chlorahexidine gluconate) Soap before  surgery.  CHG is an antiseptic cleaner which kills germs and bonds with the skin to continue killing germs even after washing.    Oral Hygiene is also important to reduce your risk of infection.  Remember - BRUSH YOUR TEETH THE MORNING OF SURGERY WITH YOUR REGULAR TOOTHPASTE  Please do not use if you have an allergy to CHG or antibacterial soaps. If your skin becomes reddened/irritated stop using the CHG.  Do not shave (including legs and underarms) for at least 48 hours prior to first CHG shower. It is OK to shave your face.  Please follow these instructions  carefully.   Shower the NIGHT BEFORE SURGERY and the MORNING OF SURGERY  If you chose to wash your hair, wash your hair first as usual with your normal shampoo.  After you shampoo, rinse your hair and body thoroughly to remove the shampoo.  Use CHG Soap as you would any other liquid soap. You can apply CHG directly to the skin and wash gently with a scrungie or a clean washcloth.   Apply the CHG Soap to your body ONLY FROM THE NECK DOWN.  Do not use on open wounds or open sores. Avoid contact with your eyes, ears, mouth and genitals (private parts). Wash Face and genitals (private parts)  with your normal soap.   Wash thoroughly, paying special attention to the area where your surgery will be performed.  Thoroughly rinse your body with warm water from the neck down.  DO NOT shower/wash with your normal soap after using and rinsing off the CHG Soap.  Pat yourself dry with a CLEAN TOWEL.  Wear CLEAN PAJAMAS to bed the night before surgery  Place CLEAN SHEETS on your bed the night before your surgery  DO NOT SLEEP WITH PETS.   Day of Surgery: Take a shower with CHG soap. Do not wear jewelry or makeup Do not wear lotions, powders, perfumes, or deodorant. Do not shave 48 hours prior to surgery.   Do not bring valuables to the hospital. [facility] is not responsible for any belongings or valuables. Do not wear nail polish, gel polish, artificial nails, or any other type of covering on natural nails (fingers and toes) If you have artificial nails or gel coating that need to be removed by a nail salon, please have this removed prior to surgery. Artificial nails or gel coating may interfere with anesthesia's ability to adequately monitor your vital signs. Wear Clean/Comfortable clothing the morning of surgery Remember to brush your teeth WITH YOUR REGULAR TOOTHPASTE.   Please read over the following fact sheets that you were given.    If you received a COVID test during your pre-op  visit  it is requested that you wear a mask when out in public, stay away from anyone that may not be feeling well and notify your surgeon if you develop symptoms. If you have been in contact with anyone that has tested positive in the last 10 days please notify you surgeon.

## 2022-12-21 ENCOUNTER — Other Ambulatory Visit: Payer: Self-pay

## 2022-12-21 ENCOUNTER — Encounter (HOSPITAL_COMMUNITY)
Admission: RE | Admit: 2022-12-21 | Discharge: 2022-12-21 | Disposition: A | Discharge status: Home / Self Care | Payer: BC Managed Care – PPO | Source: Ambulatory Visit | Attending: General Surgery | Admitting: General Surgery

## 2022-12-21 ENCOUNTER — Encounter (HOSPITAL_COMMUNITY): Payer: Self-pay

## 2022-12-21 VITALS — BP 161/78 | HR 54 | Temp 98.1°F | Resp 17 | Ht 64.0 in | Wt 242.0 lb

## 2022-12-21 DIAGNOSIS — C50411 Malignant neoplasm of upper-outer quadrant of right female breast: Secondary | ICD-10-CM | POA: Diagnosis not present

## 2022-12-21 DIAGNOSIS — Z170 Estrogen receptor positive status [ER+]: Secondary | ICD-10-CM | POA: Diagnosis not present

## 2022-12-21 DIAGNOSIS — K219 Gastro-esophageal reflux disease without esophagitis: Secondary | ICD-10-CM | POA: Diagnosis not present

## 2022-12-21 DIAGNOSIS — N6325 Unspecified lump in the left breast, overlapping quadrants: Secondary | ICD-10-CM | POA: Diagnosis not present

## 2022-12-21 DIAGNOSIS — Z01810 Encounter for preprocedural cardiovascular examination: Secondary | ICD-10-CM | POA: Insufficient documentation

## 2022-12-21 DIAGNOSIS — N6011 Diffuse cystic mastopathy of right breast: Secondary | ICD-10-CM | POA: Diagnosis not present

## 2022-12-21 DIAGNOSIS — Z01818 Encounter for other preprocedural examination: Secondary | ICD-10-CM

## 2022-12-21 DIAGNOSIS — Z7951 Long term (current) use of inhaled steroids: Secondary | ICD-10-CM | POA: Insufficient documentation

## 2022-12-21 DIAGNOSIS — Z6841 Body Mass Index (BMI) 40.0 and over, adult: Secondary | ICD-10-CM | POA: Diagnosis not present

## 2022-12-21 DIAGNOSIS — N6041 Mammary duct ectasia of right breast: Secondary | ICD-10-CM | POA: Diagnosis not present

## 2022-12-21 DIAGNOSIS — R001 Bradycardia, unspecified: Secondary | ICD-10-CM | POA: Insufficient documentation

## 2022-12-21 DIAGNOSIS — J45909 Unspecified asthma, uncomplicated: Secondary | ICD-10-CM | POA: Insufficient documentation

## 2022-12-21 DIAGNOSIS — I2510 Atherosclerotic heart disease of native coronary artery without angina pectoris: Secondary | ICD-10-CM | POA: Insufficient documentation

## 2022-12-21 DIAGNOSIS — Z87891 Personal history of nicotine dependence: Secondary | ICD-10-CM | POA: Insufficient documentation

## 2022-12-21 DIAGNOSIS — M797 Fibromyalgia: Secondary | ICD-10-CM | POA: Diagnosis not present

## 2022-12-21 HISTORY — DX: Personal history of urinary calculi: Z87.442

## 2022-12-21 NOTE — Progress Notes (Signed)
PCP - Dr. Azalia Bilis Cardiologist - denies  PPM/ICD - n/a Device Orders - n/a Rep Notified - n/a  Chest x-ray - n/a EKG - 12/21/22 Stress Test - denies ECHO - 11/19/14 Cardiac Cath - 11/2014  Sleep Study - Years ago. Normal per patient. CPAP - n/a  Fasting Blood Sugar - n/a Checks Blood Sugar _____ times a day- n/a  Last dose of GLP1 agonist-  n/a GLP1 instructions: n/a  Blood Thinner Instructions: n/a Aspirin Instructions: n/a  ERAS Protcol - Clear liquids until 1015 am day of surgery PRE-SURGERY Ensure or G2- Ensure  COVID TEST- n/a   Anesthesia review: Yes. Seed Placement. EKG Review  Patient denies shortness of breath, fever, cough and chest pain at PAT appointment   All instructions explained to the patient, with a verbal understanding of the material. Patient agrees to go over the instructions while at home for a better understanding. Patient also instructed to self quarantine after being tested for COVID-19. The opportunity to ask questions was provided.

## 2022-12-22 DIAGNOSIS — C50811 Malignant neoplasm of overlapping sites of right female breast: Secondary | ICD-10-CM | POA: Diagnosis not present

## 2022-12-22 DIAGNOSIS — R928 Other abnormal and inconclusive findings on diagnostic imaging of breast: Secondary | ICD-10-CM | POA: Diagnosis not present

## 2022-12-22 NOTE — Anesthesia Preprocedure Evaluation (Addendum)
Anesthesia Evaluation  Patient identified by MRN, date of birth, ID band Patient awake    Reviewed: Allergy & Precautions, H&P , NPO status , Patient's Chart, lab work & pertinent test results  Airway Mallampati: II  TM Distance: >3 FB Neck ROM: Full    Dental no notable dental hx. (+) Edentulous Upper, Partial Lower, Dental Advisory Given   Pulmonary asthma , former smoker   Pulmonary exam normal breath sounds clear to auscultation       Cardiovascular negative cardio ROS  Rhythm:Regular Rate:Normal     Neuro/Psych negative neurological ROS  negative psych ROS   GI/Hepatic Neg liver ROS,GERD  Medicated,,  Endo/Other    Morbid obesity  Renal/GU Renal disease  negative genitourinary   Musculoskeletal  (+)  Fibromyalgia -  Abdominal   Peds  Hematology  (+) Blood dyscrasia, anemia   Anesthesia Other Findings   Reproductive/Obstetrics negative OB ROS                             Anesthesia Physical Anesthesia Plan  ASA: 3  Anesthesia Plan: General   Post-op Pain Management: Tylenol PO (pre-op)* and Regional block*   Induction: Intravenous  PONV Risk Score and Plan: 4 or greater and Ondansetron, Dexamethasone and Midazolam  Airway Management Planned: LMA  Additional Equipment:   Intra-op Plan:   Post-operative Plan: Extubation in OR  Informed Consent: I have reviewed the patients History and Physical, chart, labs and discussed the procedure including the risks, benefits and alternatives for the proposed anesthesia with the patient or authorized representative who has indicated his/her understanding and acceptance.     Dental advisory given  Plan Discussed with: CRNA  Anesthesia Plan Comments: (PAT note by Karoline Caldwell, PA-C: Patient previously evaluated by cardiology in 2015 for left bundle branch block and chronic chest wall pain.  She has chest CT that noted some coronary  artery calcifications and is ultimately recommended she have an echocardiogram as well as a cardiac catheterization.  Echo showed normal LV systolic function with EF 55 to 60%, septal motion consistent with left bundle branch block, normal wall motion, grade 2 DD, normal valves.  Catheterization showed mild nonobstructive CAD.  Medical therapy and risk factor reduction recommended.  She was last seen by Dr. Irish Lack for evaluation of sinus bradycardia.  She was asymptomatic from this and noted to be active with regular exercise.  No further testing was recommended.  She was advised to follow-up with cardiology on an as-needed basis.  Hx of gastric bypass 2003.  Former smoker. Hx of asthma, uses albuterol PRN.  Preop labs reviewed, unremarkable.  EKG 12/21/2022: Sinus bradycardia.  Rate 52.  Left bundle branch block.  Bundle branch block also demonstrated on EKG from 11/14/2014.  Cardiac catheterization 11/15/2014: IMPRESSION: Normal left ventricular function with an ejection fraction at 55%.  Coronary calcification with mild nonobstructive CAD with mild to moderate calcification in the proximal LAD with mild 10% narrowing after the septal perforating artery and prior to the first diagonal vessel; normal left circumflex coronary artery; and minimal calcification in the RCA with 10% luminal irregularity/stenosis in the proximal segment prior to the acute marginal branch.  RECOMMENDATION: Medical therapy including aspirin and aggressive statin therapy to reduce potential for CAD progression in this patient with nonobstructive CAD and coronary calcification.  TTE 11/19/2014: - Left ventricle: The cavity size was normal. Wall thickness was  normal. Systolic function was normal. The estimated ejection  fraction was in the range of 55% to 60%. Septal bounce consistent  with LBBB. Wall motion was normal; there were no regional wall  motion abnormalities. Features are consistent with a  pseudonormal  left ventricular filling pattern, with concomitant abnormal  relaxation and increased filling pressure (grade 2 diastolic  dysfunction).  - Aortic valve: There was no stenosis.  - Mitral valve: There was trivial regurgitation.  - Left atrium: The atrium was mildly dilated.  - Right ventricle: The cavity size was normal. Systolic function  was normal.  - Tricuspid valve: Peak RV-RA gradient (S): 28 mm Hg.  - Pulmonary arteries: PA peak pressure: 31 mm Hg (S).  - Inferior vena cava: The vessel was normal in size. The  respirophasic diameter changes were in the normal range (>= 50%),  consistent with normal central venous pressure.     )        Anesthesia Quick Evaluation

## 2022-12-22 NOTE — Progress Notes (Signed)
Anesthesia Chart Review:  Patient previously evaluated by cardiology in 2015 for left bundle branch block and chronic chest wall pain.  She has chest CT that noted some coronary artery calcifications and is ultimately recommended she have an echocardiogram as well as a cardiac catheterization.  Echo showed normal LV systolic function with EF 55 to 60%, septal motion consistent with left bundle branch block, normal wall motion, grade 2 DD, normal valves.  Catheterization showed mild nonobstructive CAD.  Medical therapy and risk factor reduction recommended.  She was last seen by Dr. Irish Lack for evaluation of sinus bradycardia.  She was asymptomatic from this and noted to be active with regular exercise.  No further testing was recommended.  She was advised to follow-up with cardiology on an as-needed basis.  Hx of gastric bypass 2003.  Former smoker. Hx of asthma, uses albuterol PRN.  Preop labs reviewed, unremarkable.  EKG 12/21/2022: Sinus bradycardia.  Rate 52.  Left bundle branch block.  Bundle branch block also demonstrated on EKG from 11/14/2014.  Cardiac catheterization 11/15/2014: IMPRESSION: Normal left ventricular function with an ejection fraction at 55%.   Coronary calcification with mild nonobstructive CAD with mild to moderate calcification in the proximal LAD with mild 10% narrowing after the septal perforating artery and prior to the first diagonal vessel; normal left circumflex coronary artery; and minimal calcification in the RCA with 10% luminal irregularity/stenosis in the proximal segment prior to the acute marginal branch.   RECOMMENDATION: Medical therapy including aspirin and aggressive statin therapy to reduce potential for CAD progression in this patient with nonobstructive CAD and coronary calcification.  TTE 11/19/2014: - Left ventricle: The cavity size was normal. Wall thickness was    normal. Systolic function was normal. The estimated ejection    fraction was in  the range of 55% to 60%. Septal bounce consistent    with LBBB. Wall motion was normal; there were no regional wall    motion abnormalities. Features are consistent with a pseudonormal    left ventricular filling pattern, with concomitant abnormal    relaxation and increased filling pressure (grade 2 diastolic    dysfunction).  - Aortic valve: There was no stenosis.  - Mitral valve: There was trivial regurgitation.  - Left atrium: The atrium was mildly dilated.  - Right ventricle: The cavity size was normal. Systolic function    was normal.  - Tricuspid valve: Peak RV-RA gradient (S): 28 mm Hg.  - Pulmonary arteries: PA peak pressure: 31 mm Hg (S).  - Inferior vena cava: The vessel was normal in size. The    respirophasic diameter changes were in the normal range (>= 50%),    consistent with normal central venous pressure.     Wynonia Musty [facility] Short Stay Center/Anesthesiology Phone [phone] 12/22/2022 10:04 AM

## 2022-12-23 ENCOUNTER — Ambulatory Visit (HOSPITAL_COMMUNITY): Payer: BC Managed Care – PPO | Admitting: Physician Assistant

## 2022-12-23 ENCOUNTER — Ambulatory Visit (HOSPITAL_COMMUNITY)
Admission: RE | Admit: 2022-12-23 | Discharge: 2022-12-23 | Disposition: A | Discharge status: Home / Self Care | Payer: BC Managed Care – PPO | Attending: General Surgery | Admitting: General Surgery

## 2022-12-23 ENCOUNTER — Other Ambulatory Visit: Payer: Self-pay

## 2022-12-23 ENCOUNTER — Encounter (HOSPITAL_COMMUNITY): Payer: Self-pay | Admitting: General Surgery

## 2022-12-23 ENCOUNTER — Encounter (HOSPITAL_COMMUNITY)
Admission: RE | Disposition: A | Discharge status: Home / Self Care | Payer: Self-pay | Source: Home / Self Care | Attending: General Surgery

## 2022-12-23 DIAGNOSIS — Z87891 Personal history of nicotine dependence: Secondary | ICD-10-CM | POA: Insufficient documentation

## 2022-12-23 DIAGNOSIS — N6091 Unspecified benign mammary dysplasia of right breast: Secondary | ICD-10-CM | POA: Diagnosis not present

## 2022-12-23 DIAGNOSIS — N6011 Diffuse cystic mastopathy of right breast: Secondary | ICD-10-CM | POA: Diagnosis not present

## 2022-12-23 DIAGNOSIS — N6325 Unspecified lump in the left breast, overlapping quadrants: Secondary | ICD-10-CM | POA: Insufficient documentation

## 2022-12-23 DIAGNOSIS — C50411 Malignant neoplasm of upper-outer quadrant of right female breast: Secondary | ICD-10-CM | POA: Insufficient documentation

## 2022-12-23 DIAGNOSIS — I2510 Atherosclerotic heart disease of native coronary artery without angina pectoris: Secondary | ICD-10-CM | POA: Diagnosis not present

## 2022-12-23 DIAGNOSIS — N6041 Mammary duct ectasia of right breast: Secondary | ICD-10-CM | POA: Insufficient documentation

## 2022-12-23 DIAGNOSIS — D630 Anemia in neoplastic disease: Secondary | ICD-10-CM | POA: Diagnosis not present

## 2022-12-23 DIAGNOSIS — M797 Fibromyalgia: Secondary | ICD-10-CM | POA: Insufficient documentation

## 2022-12-23 DIAGNOSIS — Z6841 Body Mass Index (BMI) 40.0 and over, adult: Secondary | ICD-10-CM | POA: Insufficient documentation

## 2022-12-23 DIAGNOSIS — Z170 Estrogen receptor positive status [ER+]: Secondary | ICD-10-CM | POA: Diagnosis not present

## 2022-12-23 DIAGNOSIS — K219 Gastro-esophageal reflux disease without esophagitis: Secondary | ICD-10-CM | POA: Insufficient documentation

## 2022-12-23 DIAGNOSIS — C50911 Malignant neoplasm of unspecified site of right female breast: Secondary | ICD-10-CM | POA: Diagnosis not present

## 2022-12-23 DIAGNOSIS — J45909 Unspecified asthma, uncomplicated: Secondary | ICD-10-CM | POA: Insufficient documentation

## 2022-12-23 DIAGNOSIS — G8918 Other acute postprocedural pain: Secondary | ICD-10-CM | POA: Diagnosis not present

## 2022-12-23 HISTORY — PX: RADIOACTIVE SEED GUIDED AXILLARY SENTINEL LYMPH NODE: SHX6735

## 2022-12-23 HISTORY — PX: BREAST LUMPECTOMY WITH RADIOACTIVE SEED AND SENTINEL LYMPH NODE BIOPSY: SHX6550

## 2022-12-23 HISTORY — PX: RADIOACTIVE SEED GUIDED EXCISIONAL BREAST BIOPSY: SHX6490

## 2022-12-23 SURGERY — RADIOACTIVE SEED GUIDED BREAST BIOPSY
Anesthesia: General | Site: Breast | Laterality: Right

## 2022-12-23 MED ORDER — CEFAZOLIN SODIUM-DEXTROSE 2-4 GM/100ML-% IV SOLN
2.0000 g | INTRAVENOUS | Status: AC
  Administered 2022-12-23: 2 g via INTRAVENOUS
  Filled 2022-12-23: qty 100

## 2022-12-23 MED ORDER — ACETAMINOPHEN 500 MG PO TABS: 1000.0000 mg | ORAL_TABLET | Freq: Once | ORAL | Status: DC

## 2022-12-23 MED ORDER — MIDAZOLAM HCL 2 MG/2ML IJ SOLN: 2.0000 mg | Freq: Once | INTRAMUSCULAR | Status: AC

## 2022-12-23 MED ORDER — MIDAZOLAM HCL 2 MG/2ML IJ SOLN
INTRAMUSCULAR | Status: AC
  Filled 2022-12-23: qty 2

## 2022-12-23 MED ORDER — BUPIVACAINE-EPINEPHRINE (PF) 0.5% -1:200000 IJ SOLN
INTRAMUSCULAR | Status: DC | PRN
  Administered 2022-12-23: 4 mL

## 2022-12-23 MED ORDER — ACETAMINOPHEN 325 MG PO TABS: 650.0000 mg | ORAL_TABLET | ORAL | Status: DC | PRN

## 2022-12-23 MED ORDER — ONDANSETRON HCL 4 MG/2ML IJ SOLN
INTRAMUSCULAR | Status: AC
  Filled 2022-12-23: qty 2

## 2022-12-23 MED ORDER — SODIUM CHLORIDE 0.9 % IV SOLN: 250.0000 mL | INTRAVENOUS | Status: DC | PRN

## 2022-12-23 MED ORDER — LIDOCAINE 2% (20 MG/ML) 5 ML SYRINGE
INTRAMUSCULAR | Status: AC
  Filled 2022-12-23: qty 5

## 2022-12-23 MED ORDER — TRAMADOL HCL 50 MG PO TABS: 50.0000 mg | ORAL_TABLET | Freq: Four times a day (QID) | ORAL | 0 refills | Status: DC | PRN

## 2022-12-23 MED ORDER — MIDAZOLAM HCL 2 MG/2ML IJ SOLN
INTRAMUSCULAR | Status: DC | PRN
  Administered 2022-12-23: 2 mg via INTRAVENOUS

## 2022-12-23 MED ORDER — MAGTRACE LYMPHATIC TRACER
INTRAMUSCULAR | Status: DC | PRN
  Administered 2022-12-23: 2 mL via INTRAMUSCULAR

## 2022-12-23 MED ORDER — MIDAZOLAM HCL 2 MG/2ML IJ SOLN
INTRAMUSCULAR | Status: AC
  Administered 2022-12-23: 2 mg via INTRAVENOUS
  Filled 2022-12-23: qty 2

## 2022-12-23 MED ORDER — ACETAMINOPHEN 500 MG PO TABS
1000.0000 mg | ORAL_TABLET | ORAL | Status: AC
  Administered 2022-12-23: 1000 mg via ORAL
  Filled 2022-12-23: qty 2

## 2022-12-23 MED ORDER — BUPIVACAINE-EPINEPHRINE (PF) 0.5% -1:200000 IJ SOLN
INTRAMUSCULAR | Status: DC | PRN
  Administered 2022-12-23: 20 mL via PERINEURAL

## 2022-12-23 MED ORDER — FENTANYL CITRATE (PF) 100 MCG/2ML IJ SOLN: 100.0000 ug | Freq: Once | INTRAMUSCULAR | Status: AC

## 2022-12-23 MED ORDER — ORAL CARE MOUTH RINSE: 15.0000 mL | Freq: Once | OROMUCOSAL | Status: AC

## 2022-12-23 MED ORDER — PROPOFOL 10 MG/ML IV BOLUS
INTRAVENOUS | Status: DC | PRN
  Administered 2022-12-23: 150 mg via INTRAVENOUS
  Administered 2022-12-23: 50 mg via INTRAVENOUS

## 2022-12-23 MED ORDER — DEXAMETHASONE SODIUM PHOSPHATE 10 MG/ML IJ SOLN
INTRAMUSCULAR | Status: DC | PRN
  Administered 2022-12-23: 10 mg via INTRAVENOUS

## 2022-12-23 MED ORDER — OXYCODONE HCL 5 MG PO TABS
ORAL_TABLET | ORAL | Status: AC
  Filled 2022-12-23: qty 1

## 2022-12-23 MED ORDER — SODIUM CHLORIDE 0.9 % IV SOLN: INTRAVENOUS | Status: DC

## 2022-12-23 MED ORDER — HYDROMORPHONE HCL 1 MG/ML IJ SOLN: 0.2500 mg | INTRAMUSCULAR | Status: DC | PRN

## 2022-12-23 MED ORDER — FENTANYL CITRATE (PF) 250 MCG/5ML IJ SOLN
INTRAMUSCULAR | Status: DC | PRN
  Administered 2022-12-23: 50 ug via INTRAVENOUS

## 2022-12-23 MED ORDER — BUPIVACAINE LIPOSOME 1.3 % IJ SUSP
INTRAMUSCULAR | Status: DC | PRN
  Administered 2022-12-23: 10 mL via PERINEURAL

## 2022-12-23 MED ORDER — FENTANYL CITRATE (PF) 250 MCG/5ML IJ SOLN
INTRAMUSCULAR | Status: AC
  Filled 2022-12-23: qty 5

## 2022-12-23 MED ORDER — FENTANYL CITRATE (PF) 100 MCG/2ML IJ SOLN
INTRAMUSCULAR | Status: AC
  Administered 2022-12-23: 100 ug via INTRAVENOUS
  Filled 2022-12-23: qty 2

## 2022-12-23 MED ORDER — 0.9 % SODIUM CHLORIDE (POUR BTL) OPTIME
TOPICAL | Status: DC | PRN
  Administered 2022-12-23: 1000 mL

## 2022-12-23 MED ORDER — LACTATED RINGERS IV SOLN: INTRAVENOUS | Status: DC

## 2022-12-23 MED ORDER — ONDANSETRON HCL 4 MG/2ML IJ SOLN
INTRAMUSCULAR | Status: DC | PRN
  Administered 2022-12-23: 4 mg via INTRAVENOUS

## 2022-12-23 MED ORDER — ENSURE PRE-SURGERY PO LIQD: 296.0000 mL | Freq: Once | ORAL | Status: DC

## 2022-12-23 MED ORDER — OXYCODONE HCL 5 MG PO TABS
5.0000 mg | ORAL_TABLET | ORAL | Status: DC | PRN
  Administered 2022-12-23: 5 mg via ORAL

## 2022-12-23 MED ORDER — ACETAMINOPHEN 650 MG RE SUPP: 650.0000 mg | RECTAL | Status: DC | PRN

## 2022-12-23 MED ORDER — CHLORHEXIDINE GLUCONATE CLOTH 2 % EX PADS: 6.0000 | MEDICATED_PAD | Freq: Once | CUTANEOUS | Status: DC

## 2022-12-23 MED ORDER — BUPIVACAINE-EPINEPHRINE (PF) 0.5% -1:200000 IJ SOLN
INTRAMUSCULAR | Status: AC
  Filled 2022-12-23: qty 30

## 2022-12-23 MED ORDER — DEXAMETHASONE SODIUM PHOSPHATE 10 MG/ML IJ SOLN
INTRAMUSCULAR | Status: AC
  Filled 2022-12-23: qty 1

## 2022-12-23 MED ORDER — LIDOCAINE 2% (20 MG/ML) 5 ML SYRINGE
INTRAMUSCULAR | Status: DC | PRN
  Administered 2022-12-23: 60 mg via INTRAVENOUS

## 2022-12-23 MED ORDER — SODIUM CHLORIDE 0.9% FLUSH: 3.0000 mL | INTRAVENOUS | Status: DC | PRN

## 2022-12-23 MED ORDER — EPHEDRINE SULFATE-NACL 50-0.9 MG/10ML-% IV SOSY
PREFILLED_SYRINGE | INTRAVENOUS | Status: DC | PRN
  Administered 2022-12-23: 5 mg via INTRAVENOUS
  Administered 2022-12-23 (×2): 10 mg via INTRAVENOUS

## 2022-12-23 MED ORDER — CHLORHEXIDINE GLUCONATE 0.12 % MT SOLN
15.0000 mL | Freq: Once | OROMUCOSAL | Status: AC
  Administered 2022-12-23: 15 mL via OROMUCOSAL
  Filled 2022-12-23: qty 15

## 2022-12-23 SURGICAL SUPPLY — 52 items
ADH SKN CLS APL DERMABOND .7 (GAUZE/BANDAGES/DRESSINGS) ×2
APL PRP STRL LF DISP 70% ISPRP (MISCELLANEOUS) ×1
APPLIER CLIP 9.375 MED OPEN (MISCELLANEOUS)
APR CLP MED 9.3 20 MLT OPN (MISCELLANEOUS)
BAG COUNTER SPONGE SURGICOUNT (BAG) ×1 IMPLANT
BAG SPNG CNTER NS LX DISP (BAG) ×1
BINDER BREAST LRG (GAUZE/BANDAGES/DRESSINGS) IMPLANT
BINDER BREAST XLRG (GAUZE/BANDAGES/DRESSINGS) IMPLANT
CANISTER SUCT 3000ML PPV (MISCELLANEOUS) ×1 IMPLANT
CHLORAPREP W/TINT 26 (MISCELLANEOUS) ×1 IMPLANT
CLIP APPLIE 9.375 MED OPEN (MISCELLANEOUS) IMPLANT
CLIP VESOCCLUDE MED 6/CT (CLIP) ×1 IMPLANT
CLSR STERI-STRIP ANTIMIC 1/2X4 (GAUZE/BANDAGES/DRESSINGS) IMPLANT
CNTNR URN SCR LID CUP LEK RST (MISCELLANEOUS) IMPLANT
CONT SPEC 4OZ STRL OR WHT (MISCELLANEOUS) ×3
COVER PROBE CYLINDRICAL 5X96 (MISCELLANEOUS) IMPLANT
COVER PROBE W GEL 5X96 (DRAPES) ×2 IMPLANT
COVER SURGICAL LIGHT HANDLE (MISCELLANEOUS) ×1 IMPLANT
DERMABOND ADVANCED .7 DNX12 (GAUZE/BANDAGES/DRESSINGS) ×1 IMPLANT
DEVICE DUBIN SPECIMEN MAMMOGRA (MISCELLANEOUS) ×1 IMPLANT
DRAPE CHEST BREAST 15X10 FENES (DRAPES) ×1 IMPLANT
ELECT COATED BLADE 2.86 ST (ELECTRODE) ×1 IMPLANT
ELECT REM PT RETURN 9FT ADLT (ELECTROSURGICAL) ×1
ELECTRODE REM PT RTRN 9FT ADLT (ELECTROSURGICAL) ×1 IMPLANT
GLOVE BIO SURGEON STRL SZ7 (GLOVE) ×2 IMPLANT
GLOVE BIOGEL PI IND STRL 7.5 (GLOVE) ×1 IMPLANT
GOWN STRL REUS W/ TWL LRG LVL3 (GOWN DISPOSABLE) ×2 IMPLANT
GOWN STRL REUS W/TWL LRG LVL3 (GOWN DISPOSABLE) ×2
ILLUMINATOR WAVEGUIDE N/F (MISCELLANEOUS) IMPLANT
KIT BASIN OR (CUSTOM PROCEDURE TRAY) ×1 IMPLANT
KIT MARKER MARGIN INK (KITS) ×1 IMPLANT
LIGHT WAVEGUIDE WIDE FLAT (MISCELLANEOUS) IMPLANT
NDL 18GX1X1/2 (RX/OR ONLY) (NEEDLE) IMPLANT
NDL FILTER BLUNT 18X1 1/2 (NEEDLE) IMPLANT
NDL HYPO 25GX1X1/2 BEV (NEEDLE) ×1 IMPLANT
NEEDLE 18GX1X1/2 (RX/OR ONLY) (NEEDLE) IMPLANT
NEEDLE FILTER BLUNT 18X1 1/2 (NEEDLE) IMPLANT
NEEDLE HYPO 25GX1X1/2 BEV (NEEDLE) ×1 IMPLANT
NS IRRIG 1000ML POUR BTL (IV SOLUTION) ×1 IMPLANT
PACK GENERAL/GYN (CUSTOM PROCEDURE TRAY) ×1 IMPLANT
STRIP CLOSURE SKIN 1/2X4 (GAUZE/BANDAGES/DRESSINGS) ×1 IMPLANT
SUT MNCRL AB 4-0 PS2 18 (SUTURE) ×2 IMPLANT
SUT MON AB 5-0 PS2 18 (SUTURE) IMPLANT
SUT SILK 2 0 SH (SUTURE) IMPLANT
SUT VIC AB 2-0 SH 27 (SUTURE) ×4
SUT VIC AB 2-0 SH 27XBRD (SUTURE) ×2 IMPLANT
SUT VIC AB 3-0 SH 27 (SUTURE) ×4
SUT VIC AB 3-0 SH 27X BRD (SUTURE) ×2 IMPLANT
SYR CONTROL 10ML LL (SYRINGE) ×1 IMPLANT
TOWEL GREEN STERILE (TOWEL DISPOSABLE) ×1 IMPLANT
TOWEL GREEN STERILE FF (TOWEL DISPOSABLE) ×1 IMPLANT
TRACER MAGTRACE VIAL (MISCELLANEOUS) IMPLANT

## 2022-12-23 NOTE — H&P (Addendum)
49 yof who had screening mammogram that shows b density breast tissue. She had several findings. The left breast had a mass that was biopsied and is benign FCC and this is concordant. The right breast has two concerning areas. The first at [place] was seen on mm. This on [place] is a 1.1 cm mass. While doing the [place] there was another mass found (not seen on mm) that is at 12 oclock and measures 1x0.5x1 mc in size. Axillary [place] is negative. Biopsy of the 10 oclock lesion is a mucocele like lesion with no malignancy but needs excision. Biopsy of the 12 oclock lesion is a grade II IDC that is 100% er/pr positive, her 2 negative and Ki is 5%. She has prior benign right breast biopsy.  Review of Systems: A complete review of systems was obtained from the patient. I have reviewed this information and discussed as appropriate with the patient. See HPI as well for other ROS.  Review of Systems All other systems reviewed and are negative.  Medical History: Past Medical History: Diagnosis Date Anemia Asthma, unspecified asthma severity, unspecified whether complicated, unspecified whether persistent GERD (gastroesophageal reflux disease)  Patient Active Problem List Diagnosis Asthma 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CMS-HCC) Coronary artery calcification seen on CAT scan Cubital tunnel syndrome on right Easy bruising Family history of breast cancer Family history of ovarian cancer FH: BRCA gene positive GERD (gastroesophageal reflux disease) H/O fibromyalgia History of anemia Malignant neoplasm of upper-outer quadrant of right breast in female, estrogen receptor positive Mild intermittent asthma without complication  Past Surgical History: Procedure Laterality Date .Gastric bypass 2003 .knee replacement Left 2011 CHOLECYSTECTOMY  Allergies Allergen Reactions Pentobarbital Nausea And Vomiting and Other (See Comments) Severe nausea. Other Nausea And Vomiting Sodium penathol  Current Outpatient  Medications on File Prior to Visit Medication Sig Dispense Refill albuterol 90 mcg/actuation inhaler Inhale 2 inhalations into the lungs every 6 (six) hours as needed for Wheezing fluticasone propionate (FLONASE) 50 mcg/actuation nasal spray Place 2 sprays into both nostrils once daily  Family History Problem Relation Age of Onset Coronary Artery Disease (Blocked arteries around heart) Mother Diabetes Father Coronary Artery Disease (Blocked arteries around heart) Father   Social History  Tobacco Use Smoking Status Former Types: Cigarettes Quit date: 02/2012 Years since quitting: 10.8 Smokeless Tobacco Never Marital status: Married Tobacco Use Smoking status: Former Types: Cigarettes Quit date: 02/2012 Years since quitting: 10.8 Smokeless tobacco: Never Vaping Use Vaping Use: Never used Substance and Sexual Activity Alcohol use: Never Drug use: Never Sexual activity: Defer  Objective:  Physical Exam Vitals reviewed. Constitutional: Appearance: Normal appearance. Chest: Breasts: Right: No inverted nipple, mass or nipple discharge. Left: No inverted nipple, mass or nipple discharge. Lymphadenopathy: Upper Body: Right upper body: No supraclavicular or axillary adenopathy. Left upper body: No supraclavicular or axillary adenopathy. Neurological: Mental Status: She is alert.  Assessment and Plan:  Malignant neoplasm of upper-outer quadrant of right breast in female, estrogen receptor positive  Right breast seed guided excisional biopsy, right breast seed guided lumpectomy, right ax sn biopsy  We discussed the staging and pathophysiology of breast cancer. We discussed all of the different options for treatment for breast cancer including surgery, chemotherapy, radiation therapy, Herceptin, and antiestrogen therapy.  We discussed a sentinel lymph node biopsy as she does not appear to having lymph node involvement right now. We discussed the performance of that with  injection of Magtrace and small risk of staining. We discussed that there is a chance of having a positive node  with a sentinel lymph node biopsy and we will await the permanent pathology to make any other first further decisions in terms of her treatment. We discussed up to a 5% risk lifetime of chronic shoulder pain as well as lymphedema associated with a sentinel lymph node biopsy.  We discussed the options for treatment of the breast cancer which included lumpectomy versus a mastectomy. We discussed the performance of the lumpectomy with radioactive seed placement. We discussed a 5-10% chance of a positive margin requiring reexcision in the operating room. We also discussed that she will likely need radiation therapy if she undergoes lumpectomy. We discussed mastectomy and the postoperative care for that as well. Mastectomy can be followed by reconstruction. The decision for lumpectomy vs mastectomy has no impact on decision for chemotherapy. Most mastectomy patients will not need radiation therapy. We discussed that there is no difference in her survival whether she undergoes lumpectomy with radiation therapy or antiestrogen therapy versus a mastectomy. There is also no real difference between her recurrence in the breast.  We discussed MRI and we elected not to proceed with this.  We also discussed seed guided excisional biopsy of the "mucocele like lesion".  We discussed the risks of operation including bleeding, infection, possible reoperation. She understands her further therapy will be based on what her stages at the time of her operation.

## 2022-12-23 NOTE — Transfer of Care (Signed)
Immediate Anesthesia Transfer of Care Note  Patient: Karla [name]  Procedure(s) Performed: RADIOACTIVE SEED GUIDED EXCISIONAL RIGHT BREAST  LATERAL LESION MUCOCELE BIOPSY (Right: Breast) RIGHT BREAST LUMPECTOMY WITH RADIOACTIVE SEED (Right: Breast) RIGHT  AXILLARY SENTINEL LYMPH NODE (Right: Breast)  Patient Location: PACU  Anesthesia Type:General and Regional  Level of Consciousness: drowsy  Airway & Oxygen Therapy: Patient Spontanous Breathing and Patient connected to nasal cannula oxygen  Post-op Assessment: Report given to RN  Post vital signs: Reviewed and stable  Last Vitals:  Vitals Value Taken Time  BP 156/57 12/23/22 1502  Temp 36.4 C 12/23/22 1502  Pulse 75 12/23/22 1503  Resp 22 12/23/22 1503  SpO2 96 % 12/23/22 1503  Vitals shown include unvalidated device data.  Last Pain:  Vitals:   12/23/22 1153  TempSrc:   PainSc: 0-No pain      Patients Stated Pain Goal: 0 (91/98/02 2179)  Complications: No notable events documented.

## 2022-12-23 NOTE — Anesthesia Procedure Notes (Signed)
Anesthesia Regional Block: Pectoralis block   Pre-Anesthetic Checklist: , timeout performed,  Correct Patient, Correct Site, Correct Laterality,  Correct Procedure, Correct Position, site marked,  Risks and benefits discussed,  Pre-op evaluation,  At surgeon's request and post-op pain management  Laterality: Right  Prep: Maximum Sterile Barrier Precautions used, chloraprep       Needles:  Injection technique: Single-shot  Needle Type: Echogenic Stimulator Needle     Needle Length: 9cm  Needle Gauge: 21     Additional Needles:   Procedures:,,,, ultrasound used (permanent image in chart),,    Narrative:  Start time: 12/23/2022 11:33 AM End time: 12/23/2022 11:43 AM Injection made incrementally with aspirations every 5 mL.  Performed by: Personally  Anesthesiologist: Roderic Palau, MD

## 2022-12-23 NOTE — Op Note (Signed)
Preoperative diagnosis: Clinical stage I right breast cancer, right breast mucocele Postoperative diagnosis: Same as above Procedure: 1.  Right breast radioactive seed guided lumpectomy 2.  Right breast radioactive seed guided excisional biopsy 3.  Injection of mag trace for sentinel lymph node identification 4.  Right deep axillary sentinel lymph node biopsy Surgeon: Dr. Serita Grammes Anesthesia: General with a pectoral block Estimated blood loss: Minimal Complications: None Drains: Specimens: 1.  Right breast radioactive seed guided lumpectomy marked with paint containing seed and clips 2.  Additional superior, medial margin marked short superior, long lateral, double deep 3.  Right breast radioactive seed guided excisional biopsy containing seed and clip marked with paint 4.  Right deep axillary sentinel lymph nodes with highest count of 348 Sponge needle count was correct completion Disposition recovery stable condition  Indications: 41 yof who had screening mammogram that shows b density breast tissue. She had several findings. The left breast had a mass that was biopsied and is benign FCC and this is concordant. The right breast has two concerning areas. The first at [place] was seen on mm. This on [place] is a 1.1 cm mass. While doing the [place] there was another mass found (not seen on mm) that is at 12 oclock and measures 1x0.5x1 mc in size. Axillary [place] is negative. Biopsy of the 10 oclock lesion is a mucocele like lesion with no malignancy but needs excision. 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  Procedure: After informed consent was obtained she was taken to the operating room.  She underwent a pectoral block.  She was given antibiotics.  SCDs were placed.  She was placed under general anesthesia without complication.  She was  prepped and draped in the standard sterile surgical fashion.  A surgical timeout was then performed.  I first did the excisional biopsy.  This was in the lateralmost portion of the breast.  Infiltrated some Marcaine made a curvilinear incision overlying it.  I used the Bovie to then excise the radioactive seed and using neoprobe guidance and some surrounding tissue.  Mammogram confirmed removal of the seed and the clip.  I then closed this with 2-0 Vicryl, 3-0 Vicryl, 4-0 Monocryl.  Glue and Steri-Strips were eventually applied.  I then did a lumpectomy.  This was very close to the skin so I made a curvilinear incision in the upper portion of the breast overlying the radioactive seed.  The anterior surface is the skin.  I then used the neoprobe to remove the seed in the surrounding tissue with an attempt to get a clear margin.  Mammogram was taken confirming removal of the seed and the clip.  I did 3D imaging and I thought it might be close to a couple of additional margins so I remove these as well.  I then obtained hemostasis.  I placed a couple clips in the cavity.  I closed this with 2-0 Vicryl, 3-0 Vicryl, and 4 Monocryl.  Glue was placed over this.  I then made incision in the low axilla right below the hairline.  I carried this to the axillary fascia.  I used the Sentimag probe to identify the sentinel lymph nodes which had activity and were somewhat brown.  I removed the sentinel nodes which were a bundle altogether.  When I did this there was no additional  activity, palpable nodes, or any brown stained nodes.  These were passed off the table as a specimen.  I then obtained hemostasis.  I closed the axillary fascia with 2-0 Vicryl.  Skin was closed with 3-0 Vicryl for Monocryl.  Glue was placed.  She tolerated this well.  Postoperative bra was placed and she was transferred to recovery room.

## 2022-12-23 NOTE — Anesthesia Procedure Notes (Signed)
Procedure Name: LMA Insertion Date/Time: 12/23/2022 1:41 PM  Performed by: Barrington Ellison, CRNAPre-anesthesia Checklist: Patient identified, Emergency Drugs available, Suction available and Patient being monitored Patient Re-evaluated:Patient Re-evaluated prior to induction Oxygen Delivery Method: Circle System Utilized Preoxygenation: Pre-oxygenation with 100% oxygen Induction Type: IV induction Ventilation: Mask ventilation without difficulty LMA: LMA inserted LMA Size: 4.0 Number of attempts: 1 Placement Confirmation: positive ETCO2 Tube secured with: Tape Dental Injury: Teeth and Oropharynx as per pre-operative assessment

## 2022-12-23 NOTE — Anesthesia Postprocedure Evaluation (Signed)
Anesthesia Post Note  Patient: Karla [name]  Procedure(s) Performed: RADIOACTIVE SEED GUIDED EXCISIONAL RIGHT BREAST  LATERAL LESION MUCOCELE BIOPSY (Right: Breast) RIGHT BREAST LUMPECTOMY WITH RADIOACTIVE SEED (Right: Breast) RIGHT  AXILLARY SENTINEL LYMPH NODE (Right: Breast)     Patient location during evaluation: PACU Anesthesia Type: General and Regional Level of consciousness: awake and alert Pain management: pain level controlled Vital Signs Assessment: post-procedure vital signs reviewed and stable Respiratory status: spontaneous breathing, nonlabored ventilation and respiratory function stable Cardiovascular status: blood pressure returned to baseline and stable Postop Assessment: no apparent nausea or vomiting Anesthetic complications: no  No notable events documented.  Last Vitals:  Vitals:   12/23/22 1515 12/23/22 1528  BP: (!) 144/59 (!) 147/65  Pulse: 68 72  Resp: 16 20  Temp:  36.4 C  SpO2: 96% 94%    Last Pain:  Vitals:   12/23/22 1528  TempSrc:   PainSc: 4                  [name],W. EDMOND

## 2022-12-23 NOTE — Discharge Instructions (Signed)
[place] Office Phone Number [phone]  POST OP INSTRUCTIONS Take 400 mg of ibuprofen every 8 hours or 650 mg tylenol every 6 hours for next 72 hours then as needed. Use ice several times daily also.  A prescription for pain medication may be given to you upon discharge.  Take your pain medication as prescribed, if needed.  If narcotic pain medicine is not needed, then you may take acetaminophen (Tylenol), naprosyn (Alleve) or ibuprofen (Advil) as needed. Take your usually prescribed medications unless otherwise directed If you need a refill on your pain medication, please contact your pharmacy.  They will contact our office to request authorization.  Prescriptions will not be filled after 5pm or on week-ends. You should eat very light the first 24 hours after surgery, such as soup, crackers, pudding, etc.  Resume your normal diet the day after surgery. Most patients will experience some swelling and bruising in the breast.  Ice packs and a good support bra will help.  Wear the breast binder provided or a sports bra for 72 hours day and night.  After that wear a sports bra during the day until you return to the office. Swelling and bruising can take several days to resolve.  It is common to experience some constipation if taking pain medication after surgery.  Increasing fluid intake and taking a stool softener will usually help or prevent this problem from occurring.  A mild laxative (Milk of Magnesia or Miralax) should be taken according to package directions if there are no bowel movements after 48 hours. I used skin glue on the incision, you may shower in 24 hours.  The glue will flake off over the next 2-3 weeks.  Any sutures or staples will be removed at the office during your follow-up visit. ACTIVITIES:  You may resume regular daily activities (gradually increasing) beginning the next day.  Wearing a good support bra or sports bra minimizes pain and swelling.  You may have  sexual intercourse when it is comfortable. You may drive when you no longer are taking prescription pain medication, you can comfortably wear a seatbelt, and you can safely maneuver your car and apply brakes. RETURN TO WORK:  ______________________________________________________________________________________ Dennis Bast should see your doctor in the office for a follow-up appointment approximately two weeks after your surgery.  Your doctor's nurse will typically make your follow-up appointment when she calls you with your pathology report.  Expect your pathology report 3-4 business days after your surgery.  You may call to check if you do not hear from [place] after three days. OTHER INSTRUCTIONS: _______________________________________________________________________________________________ _____________________________________________________________________________________________________________________________________ _____________________________________________________________________________________________________________________________________ _____________________________________________________________________________________________________________________________________  WHEN TO CALL DR Lise Pincus: Fever over 101.0 Nausea and/or vomiting. Extreme swelling or bruising. Continued bleeding from incision. Increased pain, redness, or drainage from the incision.  The clinic staff is available to answer your questions during regular business hours.  Please don't hesitate to call and ask to speak to one of the nurses for clinical concerns.  If you have a medical emergency, go to the nearest emergency room or call 911.  A surgeon from [facility] is always on call at the hospital.  For further questions, please visit [email or web address]

## 2022-12-23 NOTE — Interval H&P Note (Signed)
History and Physical Interval Note:  12/23/2022 12:48 PM  Karla [name]  has presented today for surgery, with the diagnosis of RIGHT BREAST CANCER AND RIGHT BREAST MASS.  The various methods of treatment have been discussed with the patient and family. After consideration of risks, benefits and other options for treatment, the patient has consented to  Procedure(s): RADIOACTIVE SEED GUIDED EXCISIONAL RIGHT BREAST BIOPSY (Right) RIGHT BREAST LUMPECTOMY WITH RADIOACTIVE SEED AND AXILLARY SENTINEL LYMPH NODE BIOPSY (Right) as a surgical intervention.  The patient's history has been reviewed, patient examined, no change in status, stable for surgery.  I have reviewed the patient's chart and labs.  Questions were answered to the patient's satisfaction.     Rolm Bookbinder

## 2022-12-24 ENCOUNTER — Encounter (HOSPITAL_COMMUNITY): Payer: Self-pay | Admitting: General Surgery

## 2022-12-28 LAB — SURGICAL PATHOLOGY

## 2022-12-30 ENCOUNTER — Telehealth: Payer: Self-pay | Admitting: *Deleted

## 2022-12-30 ENCOUNTER — Encounter: Payer: Self-pay | Admitting: *Deleted

## 2022-12-30 NOTE — Telephone Encounter (Signed)
Received order for oncotye testing. Requisition faxed to pathology

## 2022-12-31 ENCOUNTER — Telehealth: Payer: Self-pay | Admitting: Genetic Counselor

## 2022-12-31 NOTE — Telephone Encounter (Signed)
I contacted Ms. Liz Malady to discuss her genetic testing results. No pathogenic variants were identified in the 77 genes analyzed. 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 Detailed clinic note to follow.  The test report has been scanned into EPIC and is located under the Molecular Pathology section of the Results Review tab.  A portion of the result report is included below for reference.   Lucille Passy, MS, [facility] Genetic Counselor [email or web address] (P) [phone]

## 2023-01-07 ENCOUNTER — Encounter: Payer: Self-pay | Admitting: Genetic Counselor

## 2023-01-07 ENCOUNTER — Ambulatory Visit: Payer: Self-pay | Admitting: Genetic Counselor

## 2023-01-07 DIAGNOSIS — Z1379 Encounter for other screening for genetic and chromosomal anomalies: Secondary | ICD-10-CM

## 2023-01-07 NOTE — Progress Notes (Signed)
HPI:   [name] was previously seen in the [place] clinic due to a personal and family history of cancer and concerns regarding a hereditary predisposition to cancer. Please refer to our prior cancer genetics clinic note for more information regarding our discussion, assessment and recommendations, at the time. [name] recent genetic test results were disclosed to her, as were recommendations warranted by these results. These results and recommendations are discussed in more detail below.  CANCER HISTORY:  Oncology History  Malignant neoplasm of upper-outer quadrant of right breast in female, estrogen receptor positive ([place])  11/10/2022 Mammogram   Screening mammogram showed focal asymmetry in the right breast at 12 o clock middle depth is indeterminate.  Mass in the right breast upper outer posterior depth is indeterminate. Diagnostic mammogram  confirmed 1.1 cm irregular mass in the right breast at 10 clock post depth is indeterminate. [place] recommended. 0.8 cm oval mass in the right breast 12 0 clock depht, indeterminate. [place] recommended   11/18/2022 Breast US   Bilateral breast ultrasound confirmed the above-mentioned findings.    12/03/2022 Initial Diagnosis   Malignant neoplasm of upper-outer quadrant of right breast in female, estrogen receptor positive [facility])    Pathology Results   Right breast needle core biopsy at 1:00 6 cm from nipple showed grade 2 IDC ER/PR 100% strong positive, Ki-67 of 5% and HER2 negative.  Right breast needle core biopsy at 10:00 8 to 12 cm from the right nipple showed mucocele like lesion.  Left breast needle core biopsy at 2:00 showed fibrocystic changes, negative for malignancy.    Genetic Testing   [facility] CancerNext-Expanded Panel+RNA was Negative. Report date is 12/27/2022.  The CancerNext-Expanded gene panel offered by [facility] and includes sequencing, rearrangement, and RNA analysis for the following 77 genes: AIP,  ALK, APC, ATM, AXIN2, BAP1, BARD1, BLM, BMPR1A, BRCA1, BRCA2, BRIP1, CDC73, CDH1, CDK4, CDKN1B, CDKN2A, CHEK2, CTNNA1, DICER1, FANCC, FH, FLCN, GALNT12, KIF1B, LZTR1, MAX, MEN1, MET, MLH1, MSH2, MSH3, MSH6, MUTYH, NBN, NF1, NF2, NTHL1, PALB2, PHOX2B, PMS2, POT1, PRKAR1A, PTCH1, PTEN, RAD51C, RAD51D, RB1, RECQL, RET, SDHA, SDHAF2, SDHB, SDHC, SDHD, SMAD4, SMARCA4, SMARCB1, SMARCE1, STK11, SUFU, TMEM127, TP53, TSC1, TSC2, VHL and XRCC2 (sequencing and deletion/duplication); EGFR, EGLN1, HOXB13, KIT, MITF, PDGFRA, POLD1, and POLE (sequencing only); EPCAM and GREM1 (deletion/duplication only).      FAMILY HISTORY:  We obtained a detailed, 4-generation family history.  Significant diagnoses are listed below:      Family History  Problem Relation Age of Onset   Asthma Mother          Deceased   Other Mother 44        Ruptured Bowel   GER disease Mother     Coronary artery disease Mother     Rashes / Skin problems Mother     Heart disease Mother     Heart attack Mother     Esophageal cancer Mother 53 - 49        smoked   Diabetes Father          Deceased   Heart attack Father     Coronary artery disease Father     Heart disease Father     Ovarian cancer Sister 48        maternal half-sister; "gene positive"   Healthy Brother     Ovarian cancer Maternal Aunt 73   Cancer - Other Maternal Aunt 34 - 44        "female  cancer"   Breast cancer Maternal Aunt 48   Throat cancer Maternal Grandmother     Lung cancer Maternal Grandmother 28   Lung cancer Maternal Grandfather     Cancer - Other Paternal Grandmother          "female cancer"   Arthritis/Rheumatoid Daughter     Rashes / Skin problems Daughter          psoriasis   Psoriasis Daughter             [name]'s maternal half-sister was diagnosed with ovarian cancer at age 65. She reportedly had positive genetic testing but [name] cannot obtain the results. [name] has three maternal aunts. One was diagnosed  with breast cancer at age 36, one was diagnosed with ovarian cancer at age 70, and the third aunt was diagnosed with a "female cancer" at an unknown age. [name] mother was diagnosed with esophageal cancer in her 67s, she smoked and died at age 63. Her maternal grandmother was diagnosed with esophageal cancer and lung cancer, she died in her 73s. Her maternal great grandmother (grandmother's mother) was diagnosed with breast cancer, she is deceased. [name]'s paternal grandmother was diagnosed with a "female cancer" at an unknown age, she died in her 84s. There is no reported Ashkenazi Jewish ancestry.  GENETIC TEST RESULTS:  The [facility] CancerNext-Expanded Panel found no pathogenic mutations.   The CancerNext-Expanded gene panel offered by [facility] and includes sequencing, rearrangement, and RNA analysis for the following 77 genes: AIP, ALK, APC, ATM, AXIN2, BAP1, BARD1, BLM, BMPR1A, BRCA1, BRCA2, BRIP1, CDC73, CDH1, CDK4, CDKN1B, CDKN2A, CHEK2, CTNNA1, DICER1, FANCC, FH, FLCN, GALNT12, KIF1B, LZTR1, MAX, MEN1, MET, MLH1, MSH2, MSH3, MSH6, MUTYH, NBN, NF1, NF2, NTHL1, PALB2, PHOX2B, PMS2, POT1, PRKAR1A, PTCH1, PTEN, RAD51C, RAD51D, RB1, RECQL, RET, SDHA, SDHAF2, SDHB, SDHC, SDHD, SMAD4, SMARCA4, SMARCB1, SMARCE1, STK11, SUFU, TMEM127, TP53, TSC1, TSC2, VHL and XRCC2 (sequencing and deletion/duplication); EGFR, EGLN1, HOXB13, KIT, MITF, PDGFRA, POLD1, and POLE (sequencing only); EPCAM and GREM1 (deletion/duplication only).    The test report has been scanned into EPIC and is located under the Molecular Pathology section of the Results Review tab.  A portion of the result report is included below for reference. Genetic testing reported out on 12/27/2022.      Genetic testing identified a variant of uncertain significance (VUS) in the BAP1 gene called [id number].  At this time, it is unknown if this variant is associated with an increased risk for cancer or if it is benign, but most  uncertain variants are reclassified to benign. It should not be used to make medical management decisions. With time, we suspect the laboratory will determine the significance of this variant, if any. If the laboratory reclassifies this variant, we will attempt to contact [name] to discuss it further.   Even though a pathogenic variant was not identified, possible explanations for the cancer in the family may include: There may be no hereditary risk for cancer in the family. The cancers in [name] and/or her family may be due to other genetic or environmental factors. There may be a gene mutation in one of these genes that current testing methods cannot detect, but that chance is small. There could be another gene that has not yet been discovered, or that we have not yet tested, that is responsible for the cancer diagnoses in the family.  It is also possible there is a hereditary cause for  the cancer in the family that [name] did not inherit.  Therefore, it is important to remain in touch with cancer genetics in the future so that we can continue to offer [name] the most up to date genetic testing.   ADDITIONAL GENETIC TESTING:  We discussed with [name] that her genetic testing was fairly extensive.  If there are genes identified to increase cancer risk that can be analyzed in the future, we would be happy to discuss and coordinate this testing at that time.    CANCER SCREENING RECOMMENDATIONS:  [name] test result is considered negative (normal).  This means that we have not identified a hereditary cause for her personal and family history of cancer at this time.   An individual's cancer risk and medical management are not determined by genetic test results alone. Overall cancer risk assessment incorporates additional factors, including personal medical history, family history, and any available genetic information that may result in a  personalized plan for cancer prevention and surveillance. Therefore, it is recommended she continue to follow the cancer management and screening guidelines provided by her oncology and primary healthcare provider.  RECOMMENDATIONS FOR FAMILY MEMBERS:   Since she did not inherit a mutation in a cancer predisposition gene included on this panel, her daughter could not have inherited a mutation from her in one of these genes. Individuals in this family might be at some increased risk of developing cancer, over the general population risk, due to the family history of cancer. We recommend women in this family have a yearly mammogram beginning at age 46, or 72 years younger than the earliest onset of cancer, an annual clinical breast exam, and perform monthly breast self-exams. We do not recommend familial testing for the BAP1 variant of uncertain significance (VUS).  FOLLOW-UP:  Cancer genetics is a rapidly advancing field and it is possible that new genetic tests will be appropriate for her and/or her family members in the future. We encouraged her to remain in contact with cancer genetics on an annual basis so we can update her personal and family histories and let her know of advances in cancer genetics that may benefit this family.   Our contact number was provided. [name] questions were answered to her satisfaction, and she knows she is welcome to call us at anytime with additional questions or concerns.   Lucille Passy, MS, [facility] Genetic Counselor [email or web address] (P) [phone]

## 2023-01-12 NOTE — Therapy (Signed)
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,left   Avascular necrosis ([place])    LEFT KNEE   Blood transfusion without reported diagnosis    Chicken pox    Childhood asthma    Easy bruising    Eczema    hands   ESR raised    ESR raised    Family history of ovarian cancer    Fibromyalgia    GERD (gastroesophageal reflux disease)    H/O gastric bypass    History of anemia    History of kidney stones    Iron deficiency    Kidney stone    Nephrolithiasis    Osteopenia    Tubular adenoma of colon    Ulcer    2004   Vitamin C deficiency    Vitamin D deficiency    Past Surgical History:  Procedure Laterality Date   ABDOMINAL HYSTERECTOMY  2000   w/opph   APPENDECTOMY     BREAST BIOPSY Right 1997   BREAST LUMPECTOMY WITH RADIOACTIVE SEED AND SENTINEL LYMPH NODE BIOPSY Right 12/23/2022   Procedure: RIGHT BREAST LUMPECTOMY WITH RADIOACTIVE SEED;  Surgeon: Rolm Bookbinder, MD;  Location: [facility];  Service: General;  Laterality: Right;   CHOLECYSTECTOMY  2003   DENTAL SURGERY     All Upper Removed   GASTRIC BYPASS  2003   LEFT HEART CATHETERIZATION WITH CORONARY ANGIOGRAM N/A 11/15/2014   Procedure: LEFT HEART CATHETERIZATION WITH CORONARY ANGIOGRAM;  Surgeon: Troy Sine, MD;  Location: [facility] CATH LAB;  Service: Cardiovascular;  Laterality: N/A;   left knee replacement  01.2011    RADIOACTIVE SEED GUIDED AXILLARY SENTINEL LYMPH NODE Right 12/23/2022   Procedure: RIGHT  AXILLARY SENTINEL LYMPH NODE;  Surgeon: Rolm Bookbinder, MD;  Location: [facility];  Service: General;  Laterality: Right;   RADIOACTIVE SEED GUIDED EXCISIONAL BREAST BIOPSY Right 12/23/2022   Procedure: RADIOACTIVE SEED GUIDED EXCISIONAL RIGHT BREAST  LATERAL LESION MUCOCELE BIOPSY;  Surgeon: Rolm Bookbinder, MD;  Location: [place];  Service: General;  Laterality: Right;   Patient Active Problem List   Diagnosis Date Noted   Malignant neoplasm of upper-outer quadrant of right breast in female, estrogen receptor positive ([place]) 12/03/2022   Effusion, right knee 06/12/2020   Genetic testing 12/24/2014   Family history of breast cancer    Family history of ovarian cancer    Coronary artery calcification seen on CAT scan 11/14/2014   SOB (shortness of breath) 11/14/2014   LBBB (left bundle branch block) 11/14/2014   Airway hyperreactivity 11/12/2014   Other disorders of bone development and growth, unspecified site 11/12/2014   Fibrositis 11/12/2014   Mild intermittent asthma without complication 24/58/0998   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[place]) 11/12/2014  Chest pain 10/29/2014   Vitamin C deficiency 10/17/2014   Iron deficiency anemia 10/17/2014   FH: BRCA gene positive 10/17/2014   Easy bruising 09/16/2014   History of anemia 09/16/2014   ESR raised 09/16/2014   Other malaise and fatigue 09/11/2013   Severe obesity (BMI >= 40) ([place]) 09/11/2013   GERD (gastroesophageal reflux disease) 07/18/2013   H/O fibromyalgia 07/18/2013   Encounter for preventive health examination 07/18/2013   Cubital tunnel syndrome on right 07/18/2013   Acute bronchitis 07/18/2013   Routine general medical examination at a health care facility 12/13/2012    PCP: Shirline Frees, MD  REFERRING PROVIDER: Rolm Bookbinder, MD  REFERRING DIAG: R breast cancer  THERAPY DIAG:  Abnormal posture  Malignant neoplasm of upper-outer  quadrant of right breast in female, estrogen receptor positive ([place])  Rationale for Evaluation and Treatment: Rehabilitation  ONSET DATE: 11/11/23  SUBJECTIVE:                                                                                                                                                                                           SUBJECTIVE STATEMENT: There is a little swelling under the armpit and it is tender in there. I can lift my arms way up over my head. I just have tenderness where my incisions are. I will need radiation but they are waiting on my oncotype score to see if I need chemo first.   PERTINENT HISTORY:  Patient was diagnosed on 11/10/2022 with right grade 2 invasive ductal carcinoma breast cancer. It measures 1.1 and 1 cm (2 masses) and is located in the upper outer quadrant. It is ER/PR positive and HER2 negative with a Ki67 of 5%. She had a gastic bypass in 2004 and a left total knee replacement in 2000.  12/23/22 R breast lumpectomy and SLNB (0/4)  PATIENT GOALS:  Reassess how my recovery is going related to arm function, pain, and swelling.  PAIN:  Are you having pain? No  PRECAUTIONS: Recent Surgery, right UE Lymphedema risk, Other: L TKA  ACTIVITY LEVEL / LEISURE: pt reports she goes to the gym to exercise 5x/wk for 56mn to 1 hour   OBJECTIVE:   PATIENT SURVEYS:  QUICK DASH:  Quick Dash - 01/13/23 0001     Open a tight or new jar Moderate difficulty    Do heavy household chores (wash walls, wash floors) Mild difficulty    Carry a shopping bag or briefcase Moderate difficulty    Wash your back No difficulty    Use a knife to cut food No difficulty    Recreational activities in which you take some force  or impact through your arm, shoulder, or hand (golf, hammering, tennis) Mild difficulty    During the past week, to what extent has your arm, shoulder or hand problem interfered with your normal social activities with family, friends, neighbors, or  groups? Slightly    During the past week, to what extent has your arm, shoulder or hand problem limited your work or other regular daily activities Slightly    Arm, shoulder, or hand pain. Mild    Tingling (pins and needles) in your arm, shoulder, or hand Mild    Difficulty Sleeping Mild difficulty    DASH Score 25 %              OBSERVATIONS: Some post op edema in area of R axilla just superior to SLNB scar - pt reports this has been doing down  POSTURE:  Forward head and rounded shoulders posture   UPPER EXTREMITY AROM/PROM:   A/PROM RIGHT   eval   RIGHT 01/13/23  Shoulder extension 50 68  Shoulder flexion 144 166  Shoulder abduction 140 164  Shoulder internal rotation 37 60  Shoulder external rotation 75 83                          (Blank rows = not tested)   A/PROM LEFT   eval  Shoulder extension 51  Shoulder flexion 139  Shoulder abduction 153  Shoulder internal rotation 66  Shoulder external rotation 72                          (Blank rows = not tested)   LYMPHEDEMA ASSESSMENTS:    LANDMARK RIGHT   eval RIGHT 01/13/23  10 cm proximal to olecranon process 38.8 38.5  Olecranon process 27.2 27.5  10 cm proximal to ulnar styloid process 24 22.7  Just proximal to ulnar styloid process 17.7 17.1  Across hand at thumb web space 18.9 18.5  At base of 2nd digit 6.8 6.5  (Blank rows = not tested)   LANDMARK LEFT   eval  10 cm proximal to olecranon process 38.8  Olecranon process 29.4  10 cm proximal to ulnar styloid process 23.1  Just proximal to ulnar styloid process 17.8  Across hand at thumb web space 18.5  At base of 2nd digit 6.7  (Blank rows = not tested)  Surgery type/Date: 12/13/22 R breast lumpectomy and SLNB Number of lymph nodes removed: 4 nodes all negative Current/past treatment (chemo, radiation, hormone therapy): will need radiation, unsure about chemo Other symptoms:  Heaviness/tightness No Pain No tenderness Pitting edema No Infections  No Decreased scar mobility Yes scar still healing Stemmer sign No  PATIENT EDUCATION:  Education details: scar mobilization, ABC class, continuing SOZO, importance of exercise Person educated: Patient Education method: Explanation and Handouts Education comprehension: verbalized understanding  HOME EXERCISE PROGRAM: Reviewed previously given post op HEP.   ASSESSMENT:  CLINICAL IMPRESSION: Pt returns to PT after undergoing a R lumpectomy and SLNB on 12/23/22. Pt has returned to baseline shoulder ROM. She does have some post op edema in her R axilla but reports it has been going away. Pt will continue to come for ldex screenings every 3 months for the first 2 yrs post op to test for subclinical lymphedema. She will also attend the ABC class to learn how to lower her risk of developing lymphedema. Pt will be discharged from skilled PT services at this time.   Pt  will benefit from skilled therapeutic intervention to improve on the following deficits: Decreased knowledge of precautions, impaired UE functional use, pain, decreased ROM, postural dysfunction.   PT treatment/interventions: ADL/Self care home management,    GOALS: Goals reviewed with patient? Yes  LONG TERM GOALS:  (STG=LTG)  GOALS Name Target Date  Goal status  1 Pt will demonstrate she has regained full shoulder ROM and function post operatively compared to baselines.  Baseline: 01/13/23 MET     PLAN:  PT FREQUENCY/DURATION: d/c this visit  PLAN FOR NEXT SESSION: continue ldex screenings every 3 months for the first 2 years post op   [facility]  [address]  [place] [address]  [phone]  After Breast Cancer Class It is recommended you attend the ABC class to be educated on lymphedema risk reduction. This class is free of charge and lasts for 1 hour. It is a 1-time class. You will need to download the Webex app either on your phone or computer. We will send you a link  the night before or the morning of the class. You should be able to click on that link to join the class. This is not a confidential class. You don't have to turn your camera on, but other participants may be able to see your email address.  Scar massage You can begin gentle scar massage to you incision sites. Gently place one hand on the incision and move the skin (without sliding on the skin) in various directions. Do this for a few minutes and then you can gently massage either coconut oil or vitamin E cream into the scars.  Compression garment You should continue wearing your compression bra until you feel like you no longer have swelling.  Home exercise Program Continue doing the exercises you were given until you feel like you can do them without feeling any tightness at the end.   Walking Program Studies show that 30 minutes of walking per day (fast enough to elevate your heart rate) can significantly reduce the risk of a cancer recurrence. If you can't walk due to other medical reasons, we encourage you to find another activity you could do (like a stationary bike or water exercise).  Posture After breast cancer surgery, people frequently sit with rounded shoulders posture because it puts their incisions on slack and feels better. If you sit like this and scar tissue forms in that position, you can become very tight and have pain sitting or standing with good posture. Try to be aware of your posture and sit and stand up tall to heal properly.  Follow up PT: It is recommended you return every 3 months for the first 3 years following surgery to be assessed on the SOZO machine for an L-Dex score. This helps prevent clinically significant lymphedema in 95% of patients. These follow up screens are 10 minute appointments that you are not billed for.  [facility] [place], PT 01/13/2023, 10:58 AM   PHYSICAL THERAPY DISCHARGE SUMMARY  Visits from Start of Care: 2  Current functional level  related to goals / functional outcomes: All goals met   Remaining deficits: None   Education / Equipment: HEP, scar massage, ABC class   Patient agrees to discharge. Patient goals were met. Patient is being discharged due to meeting the stated rehab goals.  [facility] [place], [place] 01/13/23 10:59 AM

## 2023-01-13 ENCOUNTER — Ambulatory Visit: Payer: BC Managed Care – PPO | Attending: General Surgery | Admitting: Physical Therapy

## 2023-01-13 ENCOUNTER — Encounter: Payer: Self-pay | Admitting: Physical Therapy

## 2023-01-13 DIAGNOSIS — R293 Abnormal posture: Secondary | ICD-10-CM | POA: Insufficient documentation

## 2023-01-13 DIAGNOSIS — Z170 Estrogen receptor positive status [ER+]: Secondary | ICD-10-CM | POA: Insufficient documentation

## 2023-01-13 DIAGNOSIS — C50411 Malignant neoplasm of upper-outer quadrant of right female breast: Secondary | ICD-10-CM | POA: Insufficient documentation

## 2023-01-14 ENCOUNTER — Encounter: Payer: Self-pay | Admitting: *Deleted

## 2023-01-19 ENCOUNTER — Encounter: Payer: Self-pay | Admitting: *Deleted

## 2023-01-19 ENCOUNTER — Telehealth: Payer: Self-pay | Admitting: *Deleted

## 2023-01-19 DIAGNOSIS — Z170 Estrogen receptor positive status [ER+]: Secondary | ICD-10-CM

## 2023-01-19 DIAGNOSIS — C50411 Malignant neoplasm of upper-outer quadrant of right female breast: Secondary | ICD-10-CM | POA: Diagnosis not present

## 2023-01-19 NOTE — Telephone Encounter (Signed)
Received oncotype score of 3. Physician team notified. Called pt, discussed results and chemo not recommended. Received verbal understanding. Informed next step is xrt with Dr. Lisbeth Renshaw.

## 2023-01-20 ENCOUNTER — Inpatient Hospital Stay: Payer: BC Managed Care – PPO | Attending: Hematology and Oncology | Admitting: Hematology and Oncology

## 2023-01-20 VITALS — BP 150/87 | HR 55 | Temp 98.1°F | Resp 16 | Ht 64.0 in | Wt 244.9 lb

## 2023-01-20 DIAGNOSIS — Z87891 Personal history of nicotine dependence: Secondary | ICD-10-CM | POA: Insufficient documentation

## 2023-01-20 DIAGNOSIS — Z170 Estrogen receptor positive status [ER+]: Secondary | ICD-10-CM

## 2023-01-20 DIAGNOSIS — C50411 Malignant neoplasm of upper-outer quadrant of right female breast: Secondary | ICD-10-CM | POA: Diagnosis not present

## 2023-01-20 NOTE — Progress Notes (Signed)
[facility] [place] NOTE  Patient Care Team: Shirline Frees, MD as PCP - General (Family Medicine) Mauro Kaufmann, RN as Oncology Nurse Navigator Rockwell Germany, RN as Oncology Nurse Navigator Rolm Bookbinder, MD as Consulting Physician (General Surgery) Benay Pike, MD as Consulting Physician (Hematology and Oncology) Kyung Rudd, MD as Consulting Physician (Radiation Oncology)  CHIEF COMPLAINTS/PURPOSE OF CONSULTATION:  Newly diagnosed breast cancer  HISTORY OF PRESENTING ILLNESS:  Karla [name] 62 y.o. female is here because of recent diagnosis of right breast cancer  I reviewed her records extensively and collaborated the history with the patient.  SUMMARY OF ONCOLOGIC HISTORY: Oncology History  Malignant neoplasm of upper-outer quadrant of right breast in female, estrogen receptor positive ([place])  11/10/2022 Mammogram   Screening mammogram showed focal asymmetry in the right breast at 12 o clock middle depth is indeterminate.  Mass in the right breast upper outer posterior depth is indeterminate. Diagnostic mammogram  confirmed 1.1 cm irregular mass in the right breast at 10 clock post depth is indeterminate. [place] recommended. 0.8 cm oval mass in the right breast 12 0 clock depht, indeterminate. [place] recommended   11/18/2022 Breast US   Bilateral breast ultrasound confirmed the above-mentioned findings.    12/03/2022 Initial Diagnosis   Malignant neoplasm of upper-outer quadrant of right breast in female, estrogen receptor positive [facility])    Pathology Results   Right breast needle core biopsy at 1:00 6 cm from nipple showed grade 2 IDC ER/PR 100% strong positive, Ki-67 of 5% and HER2 negative.  Right breast needle core biopsy at 10:00 8 to 12 cm from the right nipple showed mucocele like lesion.  Left breast needle core biopsy at 2:00 showed fibrocystic changes, negative for malignancy.    Genetic Testing   [facility] CancerNext-Expanded Panel+RNA was  Negative. Report date is 12/27/2022.  The CancerNext-Expanded gene panel offered by [facility] and includes sequencing, rearrangement, and RNA analysis for the following 77 genes: AIP, ALK, APC, ATM, AXIN2, BAP1, BARD1, BLM, BMPR1A, BRCA1, BRCA2, BRIP1, CDC73, CDH1, CDK4, CDKN1B, CDKN2A, CHEK2, CTNNA1, DICER1, FANCC, FH, FLCN, GALNT12, KIF1B, LZTR1, MAX, MEN1, MET, MLH1, MSH2, MSH3, MSH6, MUTYH, NBN, NF1, NF2, NTHL1, PALB2, PHOX2B, PMS2, POT1, PRKAR1A, PTCH1, PTEN, RAD51C, RAD51D, RB1, RECQL, RET, SDHA, SDHAF2, SDHB, SDHC, SDHD, SMAD4, SMARCA4, SMARCB1, SMARCE1, STK11, SUFU, TMEM127, TP53, TSC1, TSC2, VHL and XRCC2 (sequencing and deletion/duplication); EGFR, EGLN1, HOXB13, KIT, MITF, PDGFRA, POLD1, and POLE (sequencing only); EPCAM and GREM1 (deletion/duplication only).     Since her last visit here she is now status post right breast lumpectomy which showed invasive ductal carcinoma with extracellular mucin measuring 9 mm, grade 2, DCIS intermediate grade, resection margins are negative for carcinoma closest is posterior margin at 4 mm..  All margins negative for invasive carcinoma, 4 out of 4 lymph nodes without any evidence of micro, micro or isolated tumor cells.  Prior prognostic showed ER 100% positive strong staining PR 100% positive strong staining HER2 negative, Ki-67 of 5% Oncotype of 3, distant recurrence risk at 9 years of 3%, no benefit from chemotherapy She is healing well.  MEDICAL HISTORY:  Past Medical History:  Diagnosis Date   Allergy    SEASONAL   Anemia    Asthma    Avascular necrosis ([place])    knee,left   Avascular necrosis ([place])    LEFT KNEE   Blood transfusion without reported diagnosis    Chicken pox    Childhood asthma    Easy bruising  Eczema    hands   ESR raised    ESR raised    Family history of ovarian cancer    Fibromyalgia    GERD (gastroesophageal reflux disease)    H/O gastric bypass    History of anemia    History of kidney stones    Iron  deficiency    Kidney stone    Nephrolithiasis    Osteopenia    Tubular adenoma of colon    Ulcer    2004   Vitamin C deficiency    Vitamin D deficiency     SURGICAL HISTORY: Past Surgical History:  Procedure Laterality Date   ABDOMINAL HYSTERECTOMY  2000   w/opph   APPENDECTOMY     BREAST BIOPSY Right 1997   BREAST LUMPECTOMY WITH RADIOACTIVE SEED AND SENTINEL LYMPH NODE BIOPSY Right 12/23/2022   Procedure: RIGHT BREAST LUMPECTOMY WITH RADIOACTIVE SEED;  Surgeon: Rolm Bookbinder, MD;  Location: [facility];  Service: General;  Laterality: Right;   CHOLECYSTECTOMY  2003   DENTAL SURGERY     All Upper Removed   GASTRIC BYPASS  2003   LEFT HEART CATHETERIZATION WITH CORONARY ANGIOGRAM N/A 11/15/2014   Procedure: LEFT HEART CATHETERIZATION WITH CORONARY ANGIOGRAM;  Surgeon: Troy Sine, MD;  Location: [facility] CATH LAB;  Service: Cardiovascular;  Laterality: N/A;   left knee replacement  01.2011   RADIOACTIVE SEED GUIDED AXILLARY SENTINEL LYMPH NODE Right 12/23/2022   Procedure: RIGHT  AXILLARY SENTINEL LYMPH NODE;  Surgeon: Rolm Bookbinder, MD;  Location: [facility];  Service: General;  Laterality: Right;   RADIOACTIVE SEED GUIDED EXCISIONAL BREAST BIOPSY Right 12/23/2022   Procedure: RADIOACTIVE SEED GUIDED EXCISIONAL RIGHT BREAST  LATERAL LESION MUCOCELE BIOPSY;  Surgeon: Rolm Bookbinder, MD;  Location: [facility];  Service: General;  Laterality: Right;    SOCIAL HISTORY: Social History   Socioeconomic History   Marital status: Married    Spouse name: Not on file   Number of children: Not on file   Years of education: Not on file   Highest education level: Not on file  Occupational History   Not on file  Tobacco Use   Smoking status: Former    Types: Cigarettes    Quit date: 02/15/2012    Years since quitting: 10.9   Smokeless tobacco: Never   Tobacco comments:    quit 09/2010  Substance and Sexual Activity   Alcohol use: No   Drug use: No   Sexual activity: Yes    Birth  control/protection: Surgical  Other Topics Concern   Not on file  Social History Narrative   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 Patient is on disability trying to maintain it. She won a cutody battle for a 30 year old grand daughter.   Social Determinants of Health   Financial Resource Strain: Low Risk  (12/08/2022)   Overall Financial Resource Strain (CARDIA)    Difficulty of Paying Living Expenses: Not hard at all  Food Insecurity: No Food Insecurity (12/08/2022)   Hunger Vital Sign    Worried About Running Out of Food in the Last Year: Never true    Ran Out of Food in the Last Year: Never true  Transportation Needs: No Transportation Needs (12/08/2022)   PRAPARE - Hydrologist (Medical): No    Lack of Transportation (Non-Medical): No  Physical Activity:  Not on file  Stress: Not on file  Social Connections: Not on file  Intimate Partner Violence: Not on file    FAMILY HISTORY: Family History  Problem Relation Age of Onset   Asthma Mother        Deceased   Other Mother 44       Ruptured Bowel   GER disease Mother    Coronary artery disease Mother    Rashes / Skin problems Mother    Heart disease Mother    Heart attack Mother    Esophageal cancer Mother 7 - 81       smoked   Diabetes Father        Deceased   Heart attack Father    Coronary artery disease Father    Heart disease Father    Ovarian cancer Sister 69       maternal half-sister; "gene positive"   Healthy Brother    Ovarian cancer Maternal Aunt 36   Cancer - Other Maternal Aunt 39 - 58       "female cancer"   Breast cancer Maternal Aunt 68   Throat cancer Maternal Grandmother    Lung cancer Maternal Grandmother 65   Lung cancer Maternal Grandfather    Cancer - Other Paternal Grandmother        "female cancer"   Arthritis/Rheumatoid Daughter    Rashes / Skin problems Daughter         psoriasis   Psoriasis Daughter     ALLERGIES:  is allergic to sodium pentobarbital [pentobarbital] and other.  MEDICATIONS:  Current Outpatient Medications  Medication Sig Dispense Refill   acetaminophen (TYLENOL) 500 MG tablet Take 500-1,000 mg by mouth daily as needed (headaches.).     albuterol (VENTOLIN HFA) 108 (90 Base) MCG/ACT inhaler Inhale 2 puffs into the lungs every 6 (six) hours as needed for wheezing or shortness of breath (Cough). 18 g 0   Ascorbic Acid (VITAMIN C) 1000 MG tablet Take 1,000 mg by mouth in the morning.     ergocalciferol (VITAMIN D2) 1.25 MG (50000 UT) capsule Take 50,000 Units by mouth 2 (two) times a week.     fexofenadine (ALLEGRA) 60 MG tablet Take 60 mg by mouth 2 (two) times daily as needed for allergies.     ibuprofen (ADVIL) 800 MG tablet Take 800 mg by mouth every 8 (eight) hours as needed (pain.).     Multiple Vitamin (MULTIVITAMIN WITH MINERALS) TABS tablet Take 1 tablet by mouth daily. One A Day for Women 50+     promethazine-dextromethorphan (PROMETHAZINE-DM) 6.25-15 MG/5ML syrup Take 5 mLs by mouth 3 (three) times daily as needed for cough. (Patient not taking: Reported on 12/16/2022) 100 mL 0   traMADol (ULTRAM) 50 MG tablet Take 1 tablet (50 mg total) by mouth every 6 (six) hours as needed. 10 tablet 0   No current facility-administered medications for this visit.    REVIEW OF SYSTEMS:   Constitutional: Denies fevers, chills or abnormal night sweats Eyes: Denies blurriness of vision, double vision or watery eyes Ears, nose, mouth, throat, and face: Denies mucositis or sore throat Respiratory: Denies cough, dyspnea or wheezes Cardiovascular: Denies palpitation, chest discomfort or lower extremity swelling Gastrointestinal:  Denies nausea, heartburn or change in bowel habits Skin: Denies abnormal skin rashes Lymphatics: Denies new lymphadenopathy or easy bruising Neurological:Denies numbness, tingling or new weaknesses Behavioral/Psych: Mood  is stable, no new changes  Breast: Denies any palpable lumps or discharge All other systems were reviewed with the  patient and are negative.  PHYSICAL EXAMINATION: ECOG PERFORMANCE STATUS: 0 - Asymptomatic  Vitals:   01/20/23 1254  BP: (!) 150/87  Pulse: (!) 55  Resp: 16  Temp: 98.1 F (36.7 C)  SpO2: 96%   Filed Weights   01/20/23 1254  Weight: 244 lb 14.4 oz (111.1 kg)    GENERAL:alert, no distress and comfortable Right breast healing well.    LABORATORY DATA:  I have reviewed the data as listed Lab Results  Component Value Date   WBC 10.4 12/08/2022   HGB 13.3 12/08/2022   HCT 39.9 12/08/2022   MCV 86.0 12/08/2022   PLT 260 12/08/2022   Lab Results  Component Value Date   NA 139 12/08/2022   K 4.2 12/08/2022   CL 107 12/08/2022   CO2 27 12/08/2022    RADIOGRAPHIC STUDIES: I have personally reviewed the radiological reports and agreed with the findings in the report.  ASSESSMENT AND PLAN:  Malignant neoplasm of upper-outer quadrant of right breast in female, estrogen receptor positive ([place]) This is a very pleasant 62 year old likely postmenopausal, had hysterectomy 20 years ago referred to breast [place] with new diagnosis of right breast grade 2 IDC, ER/PR 100% strong staining, Ki-67 of 5%, HER2 negative.  She also has a mucocele in the right breast at 10:00 which will be excised.  We have reviewed her findings including imaging and pathology in the breast [place].  Dr. Donne Hazel will discuss with her about the utility of MRI in the setting.  At this time since given strong ER/PR positivity and small tumor, we have discussed about upfront surgery followed by consideration for Oncotype testing. She is here after lumpectomy, healing well.  Final pathology showed grade 2 invasive ductal carcinoma measuring 9 mm with extracellular mucin.  Previous prognostic showed ER/PR strong positivity, HER2 negative.  Oncotype DX resulted at 3, no benefit from chemo, distant risk  recurrence at 9 years of 3% with antiestrogen therapy.  She will now proceed with adjuvant radiation.  We have once again reviewed the benefits of antiestrogen therapy.  At this time we are leaning towards aromatase inhibitors.  I have encouraged weightbearing exercises, regular walking.  We will try to obtain a baseline bone density at the time of initiation of antiestrogen therapy.   Total time spent: 30 minutes including history, physical exam, review of records, counseling and coordination of care All questions were answered. The patient knows to call the clinic with any problems, questions or concerns.    Benay Pike, MD 01/20/23

## 2023-01-20 NOTE — Assessment & Plan Note (Signed)
This is a very pleasant 62 year old likely postmenopausal, had hysterectomy 20 years ago referred to breast [place] with new diagnosis of right breast grade 2 IDC, ER/PR 100% strong staining, Ki-67 of 5%, HER2 negative.  She also has a mucocele in the right breast at 10:00 which will be excised.  We have reviewed her findings including imaging and pathology in the breast [place].  Dr. Donne Hazel will discuss with her about the utility of MRI in the setting.  At this time since given strong ER/PR positivity and small tumor, we have discussed about upfront surgery followed by consideration for Oncotype testing. She is here after lumpectomy, healing well.  Final pathology showed grade 2 invasive ductal carcinoma measuring 9 mm with extracellular mucin.  Previous prognostic showed ER/PR strong positivity, HER2 negative.  Oncotype DX resulted at 3, no benefit from chemo, distant risk recurrence at 9 years of 3% with antiestrogen therapy.  She will now proceed with adjuvant radiation.  We have once again reviewed the benefits of antiestrogen therapy.  At this time we are leaning towards aromatase inhibitors.  I have encouraged weightbearing exercises, regular walking.  We will try to obtain a baseline bone density at the time of initiation of antiestrogen therapy.

## 2023-01-21 ENCOUNTER — Telehealth: Payer: Self-pay | Admitting: Hematology and Oncology

## 2023-01-21 ENCOUNTER — Other Ambulatory Visit: Payer: Self-pay | Admitting: Radiation Oncology

## 2023-01-21 ENCOUNTER — Inpatient Hospital Stay
Admission: RE | Admit: 2023-01-21 | Discharge: 2023-01-21 | Disposition: A | Discharge status: Home / Self Care | Payer: Self-pay | Source: Ambulatory Visit | Attending: Radiation Oncology | Admitting: Radiation Oncology

## 2023-01-21 DIAGNOSIS — Z170 Estrogen receptor positive status [ER+]: Secondary | ICD-10-CM

## 2023-01-21 NOTE — Telephone Encounter (Signed)
Spoke with patient confirming upcoming appointment 

## 2023-01-24 ENCOUNTER — Encounter (HOSPITAL_COMMUNITY): Payer: Self-pay

## 2023-01-24 NOTE — Progress Notes (Signed)
Radiation Oncology         [phone] ________________________________  Name: Karla [name]        MRN: [id number]  Date of Service: 01/25/2023 DOB: May 08, 1961  [id number], Gwyndolyn Saxon, MD  Benay Pike, MD     REFERRING PHYSICIAN: Benay Pike, MD   DIAGNOSIS: The encounter diagnosis was Malignant neoplasm of upper-outer quadrant of right breast in female, estrogen receptor positive ([place]).   HISTORY OF PRESENT ILLNESS: Karla [name] is a 62 y.o. female originally seen in the multidisciplinary breast clinic for a new diagnosis of right breast cancer. The patient was noted to have focal asymmetry in the right breast at 12 o'clock position and a mass in the right UOQ on screening mammogram. Diagnostic mammogram on 11/10/22 confirmed a 1.1 cm irregular mass in the right breast at 10 o'clock position and revealed a 0.8 cm oval mass in the right breast. Ultrasound on 11/18/22 confirmed the these findings. Right breast needle core biopsy at 1:00 6 cmfn showed grade 2 IDC ER/PR 100% strong staining, Ki-67 of 5% and HER2 negative. Right breast needle core biopsy at 10:00 8 to 12 cmfn showed mucocele like lesion.   Since her last visit, the patient underwent lumpectomy on 12/23/2022 with Dr. Donne Hazel.  Final pathology showed  fibrocystic change of the lateral lesion mucocele with seed with extravasated mucin, negative for carcinoma.  The other specimen labeled right breast with seed lumpectomy showed grade 2 invasive ductal carcinoma with extracellular mucin measuring 9 mm with associated intermediate grade DCIS.  Her resection margins were negative for carcinoma the closest was the posterior margin at 4 mm.  Additional medial and superior margins were taken and were both negative for malignancy, the superior one showed atypical ductal hyperplasia. Four sentinel lymph nodes were negative for metastatic disease.  Prognostic panel showed the tumor to be ER and PR positive, HER2 negative with  a Ki-67 of 5%.  Oncotype DX showed a result of 3 and there is no plans for any systemic chemotherapy.  She is seen to discuss adjuvant radiotherapy.   PREVIOUS RADIATION THERAPY: No   PAST MEDICAL HISTORY:  Past Medical History:  Diagnosis Date   Allergy    SEASONAL   Anemia    Asthma    Avascular necrosis ([place])    knee,left   Avascular necrosis ([place])    LEFT KNEE   Blood transfusion without reported diagnosis    Chicken pox    Childhood asthma    Easy bruising    Eczema    hands   ESR raised    ESR raised    Family history of ovarian cancer    Fibromyalgia    GERD (gastroesophageal reflux disease)    H/O gastric bypass    History of anemia    History of kidney stones    Iron deficiency    Kidney stone    Nephrolithiasis    Osteopenia    Tubular adenoma of colon    Ulcer    2004   Vitamin C deficiency    Vitamin D deficiency        PAST SURGICAL HISTORY: Past Surgical History:  Procedure Laterality Date   ABDOMINAL HYSTERECTOMY  2000   w/opph   APPENDECTOMY     BREAST BIOPSY Right 1997   BREAST LUMPECTOMY WITH RADIOACTIVE SEED AND SENTINEL LYMPH NODE BIOPSY Right 12/23/2022   Procedure: RIGHT BREAST LUMPECTOMY WITH RADIOACTIVE SEED;  Surgeon: Rolm Bookbinder, MD;  Location: [facility];  Service: General;  Laterality: Right;   CHOLECYSTECTOMY  2003   DENTAL SURGERY     All Upper Removed   GASTRIC BYPASS  2003   LEFT HEART CATHETERIZATION WITH CORONARY ANGIOGRAM N/A 11/15/2014   Procedure: LEFT HEART CATHETERIZATION WITH CORONARY ANGIOGRAM;  Surgeon: Troy Sine, MD;  Location: [facility] C[facility] LAB;  Service: Cardiovascular;  Laterality: N/A;   left knee replacement  01.2011   RADIOACTIVE SEED GUIDED AXILLARY SENTINEL LYMPH NODE Right 12/23/2022   Procedure: RIGHT  AXILLARY SENTINEL LYMPH NODE;  Surgeon: Rolm Bookbinder, MD;  Location: [facility];  Service: General;  Laterality: Right;   RADIOACTIVE SEED GUIDED EXCISIONAL BREAST BIOPSY Right 12/23/2022   Procedure:  RADIOACTIVE SEED GUIDED EXCISIONAL RIGHT BREAST  LATERAL LESION MUCOCELE BIOPSY;  Surgeon: Rolm Bookbinder, MD;  Location: [facility];  Service: General;  Laterality: Right;     FAMILY HISTORY:  Family History  Problem Relation Age of Onset   Asthma Mother        Deceased   Other Mother 33       Ruptured Bowel   GER disease Mother    Coronary artery disease Mother    Rashes / Skin problems Mother    Heart disease Mother    Heart attack Mother    Esophageal cancer Mother 41 - 81       smoked   Diabetes Father        Deceased   Heart attack Father    Coronary artery disease Father    Heart disease Father    Ovarian cancer Sister 64       maternal half-sister; "gene positive"   Healthy Brother    Ovarian cancer Maternal Aunt 23   Cancer - Other Maternal Aunt 49 - 69       "female cancer"   Breast cancer Maternal Aunt 69   Throat cancer Maternal Grandmother    Lung cancer Maternal Grandmother 78   Lung cancer Maternal Grandfather    Cancer - Other Paternal Grandmother        "female cancer"   Arthritis/Rheumatoid Daughter    Rashes / Skin problems Daughter        psoriasis   Psoriasis Daughter      SOCIAL HISTORY:  reports that she quit smoking about 10 years ago. Her smoking use included cigarettes. She has never used smokeless tobacco. She reports that she does not drink alcohol and does not use drugs.  The patient is married and  lives in [place].  She is retired from [employer]. She and her husband raise their 28 year old granddaughter who is very active in weight lifting. The patient often goes to the gym with her and enjoys exercising together with her.   ALLERGIES: Sodium pentobarbital [pentobarbital] and Other   MEDICATIONS:  Current Outpatient Medications  Medication Sig Dispense Refill   acetaminophen (TYLENOL) 500 MG tablet Take 500-1,000 mg by mouth daily as needed (headaches.).     albuterol (VENTOLIN HFA) 108 (90 Base) MCG/ACT inhaler Inhale 2 puffs  into the lungs every 6 (six) hours as needed for wheezing or shortness of breath (Cough). 18 g 0   Ascorbic Acid (VITAMIN C) 1000 MG tablet Take 1,000 mg by mouth in the morning.     ergocalciferol (VITAMIN D2) 1.25 MG (50000 UT) capsule Take 50,000 Units by mouth 2 (two) times a week.     fexofenadine (ALLEGRA) 60 MG tablet Take 60 mg by mouth 2 (two) times daily as needed for  allergies.     ibuprofen (ADVIL) 800 MG tablet Take 800 mg by mouth every 8 (eight) hours as needed (pain.).     Multiple Vitamin (MULTIVITAMIN WITH MINERALS) TABS tablet Take 1 tablet by mouth daily. One A Day for Women 50+     promethazine-dextromethorphan (PROMETHAZINE-DM) 6.25-15 MG/5ML syrup Take 5 mLs by mouth 3 (three) times daily as needed for cough. (Patient not taking: Reported on 12/16/2022) 100 mL 0   traMADol (ULTRAM) 50 MG tablet Take 1 tablet (50 mg total) by mouth every 6 (six) hours as needed. 10 tablet 0   No current facility-administered medications for this visit.     REVIEW OF SYSTEMS: On review of systems, the patient reports that she is doing well since surgery. She reports her range of motion is great and she's been using topical compounded vitamin e, shea butter, cocoa butter, and aloe on her skin and this has helped her incisions heal well.      PHYSICAL EXAM:  Wt Readings from Last 3 Encounters:  01/20/23 244 lb 14.4 oz (111.1 kg)  12/23/22 242 lb (109.8 kg)  12/21/22 242 lb (109.8 kg)   Temp Readings from Last 3 Encounters:  01/20/23 98.1 F (36.7 C) (Temporal)  12/23/22 97.6 F (36.4 C)  12/21/22 98.1 F (36.7 C)   BP Readings from Last 3 Encounters:  01/20/23 (!) 150/87  12/23/22 (!) 147/65  12/21/22 (!) 161/78   Pulse Readings from Last 3 Encounters:  01/20/23 (!) 55  12/23/22 72  12/21/22 (!) 54    In general this is a well appearing Caucasian female in no acute distress. She's alert and oriented x4 and appropriate throughout the examination. Cardiopulmonary assessment  is negative for acute distress and she exhibits normal effort.  Her right breast and axillary incisions are well-healed without erythema, separation, or drainage.    ECOG = 1  0 - Asymptomatic (Fully active, able to carry on all predisease activities without restriction)  1 - Symptomatic but completely ambulatory (Restricted in physically strenuous activity but ambulatory and able to carry out work of a light or sedentary nature. For example, light housework, office work)  2 - Symptomatic, <50% in bed during the day (Ambulatory and capable of all self care but unable to carry out any work activities. Up and about more than 50% of waking hours)  3 - Symptomatic, >50% in bed, but not bedbound (Capable of only limited self-care, confined to bed or chair 50% or more of waking hours)  4 - Bedbound (Completely disabled. Cannot carry on any self-care. Totally confined to bed or chair)  5 - Death   Eustace Pen MM, Creech RH, Tormey DC, et al. [id number]). "Toxicity and response criteria of the [facility] Group". [place] Oncol. 5 (6): 649-55    LABORATORY DATA:  Lab Results  Component Value Date   WBC 10.4 12/08/2022   HGB 13.3 12/08/2022   HCT 39.9 12/08/2022   MCV 86.0 12/08/2022   PLT 260 12/08/2022   Lab Results  Component Value Date   NA 139 12/08/2022   K 4.2 12/08/2022   CL 107 12/08/2022   CO2 27 12/08/2022   Lab Results  Component Value Date   ALT 23 12/08/2022   AST 21 12/08/2022   ALKPHOS 160 (H) 12/08/2022   BILITOT 0.3 12/08/2022      RADIOGRAPHY: No results found.     IMPRESSION/PLAN: 1. Stage IA, cT1cN0M0, grade 2, ER/PR positive invasive ductal carcinoma of the right  breast. Dr. Lisbeth Renshaw has reviewed her final pathology findings and we reviewed the nature of early stage right breast disease.  She has done well since surgery.  She does not need systemic chemotherapy based on her Oncotype DX score.  Dr. Lisbeth Renshaw would recommend external radiotherapy to the  breast  to reduce risks of local recurrence followed by antiestrogen therapy. We discussed the risks, benefits, short, and long term effects of radiotherapy, as well as the curative intent, and the patient is interested in proceeding.  We reviewed the delivery and logistics of radiotherapy and Dr. Lisbeth Renshaw recommends 4  weeks of radiotherapy to the right breast. Written consent is obtained and placed in the chart, a copy was provided to the patient. She will simulate this morning.   In a visit lasting 45 minutes, greater than 50% of the time was spent face to face reviewing her case, as well as in preparation of, discussing, and coordinating the patient's care.      Carola Rhine, [facility]    **Disclaimer: This note was dictated with voice recognition software. Similar sounding words can inadvertently be transcribed and this note may contain transcription errors which may not have been corrected upon publication of note.**

## 2023-01-25 ENCOUNTER — Encounter: Payer: Self-pay | Admitting: Radiation Oncology

## 2023-01-25 ENCOUNTER — Ambulatory Visit
Admission: RE | Admit: 2023-01-25 | Discharge: 2023-01-25 | Disposition: A | Discharge status: Home / Self Care | Payer: BC Managed Care – PPO | Source: Ambulatory Visit | Attending: Radiation Oncology | Admitting: Radiation Oncology

## 2023-01-25 ENCOUNTER — Encounter (HOSPITAL_COMMUNITY): Payer: Self-pay

## 2023-01-25 ENCOUNTER — Other Ambulatory Visit: Payer: Self-pay

## 2023-01-25 VITALS — BP 119/68 | HR 64 | Temp 96.0°F | Ht 64.0 in | Wt 249.0 lb

## 2023-01-25 DIAGNOSIS — C50411 Malignant neoplasm of upper-outer quadrant of right female breast: Secondary | ICD-10-CM | POA: Insufficient documentation

## 2023-01-25 DIAGNOSIS — Z170 Estrogen receptor positive status [ER+]: Secondary | ICD-10-CM | POA: Insufficient documentation

## 2023-01-25 DIAGNOSIS — Z9884 Bariatric surgery status: Secondary | ICD-10-CM | POA: Insufficient documentation

## 2023-01-25 DIAGNOSIS — M797 Fibromyalgia: Secondary | ICD-10-CM | POA: Insufficient documentation

## 2023-01-25 DIAGNOSIS — Z510 Encounter for antineoplastic radiation therapy: Secondary | ICD-10-CM | POA: Diagnosis not present

## 2023-01-25 DIAGNOSIS — M8580 Other specified disorders of bone density and structure, unspecified site: Secondary | ICD-10-CM | POA: Diagnosis not present

## 2023-01-25 DIAGNOSIS — Z87891 Personal history of nicotine dependence: Secondary | ICD-10-CM | POA: Insufficient documentation

## 2023-01-25 NOTE — Progress Notes (Signed)
Nursing interview for Malignant neoplasm of upper-outer quadrant of right breast in female, estrogen receptor positive ([place]).  Patient identity verified. Patient reports RT breast tenderness at incision lines 4/10. Incision lines healing well. No other issues reported at this time.  Meaningful use complete. Hysterectomy -NO chances of pregnancy.  Pulse 64   Temp (!) 96 F (35.6 C) (Temporal)   Ht 5' 4"$  (1.626 m)   Wt 249 lb (112.9 kg)   SpO2 100%   BMI 42.74 kg/m   This concludes the interview.   Karla Kern, LPN

## 2023-01-27 ENCOUNTER — Encounter: Payer: Self-pay | Admitting: *Deleted

## 2023-02-02 DIAGNOSIS — C50411 Malignant neoplasm of upper-outer quadrant of right female breast: Secondary | ICD-10-CM | POA: Diagnosis not present

## 2023-02-02 DIAGNOSIS — Z510 Encounter for antineoplastic radiation therapy: Secondary | ICD-10-CM | POA: Diagnosis not present

## 2023-02-02 DIAGNOSIS — Z170 Estrogen receptor positive status [ER+]: Secondary | ICD-10-CM | POA: Diagnosis not present

## 2023-02-03 ENCOUNTER — Ambulatory Visit: Payer: BC Managed Care – PPO | Admitting: Radiation Oncology

## 2023-02-04 ENCOUNTER — Ambulatory Visit: Payer: BC Managed Care – PPO

## 2023-02-07 ENCOUNTER — Ambulatory Visit
Admission: RE | Admit: 2023-02-07 | Discharge: 2023-02-07 | Disposition: A | Discharge status: Home / Self Care | Payer: BC Managed Care – PPO | Source: Ambulatory Visit | Attending: Radiation Oncology | Admitting: Radiation Oncology

## 2023-02-07 ENCOUNTER — Ambulatory Visit: Payer: BC Managed Care – PPO

## 2023-02-07 ENCOUNTER — Other Ambulatory Visit: Payer: Self-pay

## 2023-02-07 DIAGNOSIS — Z510 Encounter for antineoplastic radiation therapy: Secondary | ICD-10-CM | POA: Diagnosis not present

## 2023-02-07 DIAGNOSIS — Z170 Estrogen receptor positive status [ER+]: Secondary | ICD-10-CM | POA: Insufficient documentation

## 2023-02-07 DIAGNOSIS — C50411 Malignant neoplasm of upper-outer quadrant of right female breast: Secondary | ICD-10-CM | POA: Diagnosis not present

## 2023-02-07 LAB — RAD ONC ARIA SESSION SUMMARY
Course Elapsed Days: 0
Plan Fractions Treated to Date: 1
Plan Prescribed Dose Per Fraction: 2.66 Gy
Plan Total Fractions Prescribed: 16
Plan Total Prescribed Dose: 42.56 Gy
Reference Point Dosage Given to Date: 2.66 Gy
Reference Point Session Dosage Given: 2.66 Gy
Session Number: 1

## 2023-02-08 ENCOUNTER — Ambulatory Visit
Admission: RE | Admit: 2023-02-08 | Discharge: 2023-02-08 | Disposition: A | Discharge status: Home / Self Care | Payer: BC Managed Care – PPO | Source: Ambulatory Visit | Attending: Radiation Oncology | Admitting: Radiation Oncology

## 2023-02-08 ENCOUNTER — Other Ambulatory Visit: Payer: Self-pay

## 2023-02-08 DIAGNOSIS — Z170 Estrogen receptor positive status [ER+]: Secondary | ICD-10-CM | POA: Diagnosis not present

## 2023-02-08 DIAGNOSIS — Z510 Encounter for antineoplastic radiation therapy: Secondary | ICD-10-CM | POA: Diagnosis not present

## 2023-02-08 DIAGNOSIS — C50411 Malignant neoplasm of upper-outer quadrant of right female breast: Secondary | ICD-10-CM | POA: Diagnosis not present

## 2023-02-08 LAB — RAD ONC ARIA SESSION SUMMARY
Course Elapsed Days: 1
Plan Fractions Treated to Date: 2
Plan Prescribed Dose Per Fraction: 2.66 Gy
Plan Total Fractions Prescribed: 16
Plan Total Prescribed Dose: 42.56 Gy
Reference Point Dosage Given to Date: 5.32 Gy
Reference Point Session Dosage Given: 2.66 Gy
Session Number: 2

## 2023-02-09 ENCOUNTER — Ambulatory Visit
Admission: RE | Admit: 2023-02-09 | Discharge: 2023-02-09 | Disposition: A | Discharge status: Home / Self Care | Payer: BC Managed Care – PPO | Source: Ambulatory Visit | Attending: Radiation Oncology | Admitting: Radiation Oncology

## 2023-02-09 ENCOUNTER — Other Ambulatory Visit: Payer: Self-pay

## 2023-02-09 DIAGNOSIS — Z170 Estrogen receptor positive status [ER+]: Secondary | ICD-10-CM | POA: Diagnosis not present

## 2023-02-09 DIAGNOSIS — Z510 Encounter for antineoplastic radiation therapy: Secondary | ICD-10-CM | POA: Diagnosis not present

## 2023-02-09 DIAGNOSIS — C50411 Malignant neoplasm of upper-outer quadrant of right female breast: Secondary | ICD-10-CM | POA: Diagnosis not present

## 2023-02-09 LAB — RAD ONC ARIA SESSION SUMMARY
Course Elapsed Days: 2
Plan Fractions Treated to Date: 3
Plan Prescribed Dose Per Fraction: 2.66 Gy
Plan Total Fractions Prescribed: 16
Plan Total Prescribed Dose: 42.56 Gy
Reference Point Dosage Given to Date: 7.98 Gy
Reference Point Session Dosage Given: 2.66 Gy
Session Number: 3

## 2023-02-10 ENCOUNTER — Ambulatory Visit
Admission: RE | Admit: 2023-02-10 | Discharge: 2023-02-10 | Disposition: A | Discharge status: Home / Self Care | Payer: BC Managed Care – PPO | Source: Ambulatory Visit | Attending: Radiation Oncology | Admitting: Radiation Oncology

## 2023-02-10 ENCOUNTER — Other Ambulatory Visit: Payer: Self-pay

## 2023-02-10 DIAGNOSIS — Z510 Encounter for antineoplastic radiation therapy: Secondary | ICD-10-CM | POA: Diagnosis not present

## 2023-02-10 DIAGNOSIS — C50411 Malignant neoplasm of upper-outer quadrant of right female breast: Secondary | ICD-10-CM | POA: Diagnosis not present

## 2023-02-10 DIAGNOSIS — Z170 Estrogen receptor positive status [ER+]: Secondary | ICD-10-CM | POA: Diagnosis not present

## 2023-02-10 LAB — RAD ONC ARIA SESSION SUMMARY
Course Elapsed Days: 3
Plan Fractions Treated to Date: 4
Plan Prescribed Dose Per Fraction: 2.66 Gy
Plan Total Fractions Prescribed: 16
Plan Total Prescribed Dose: 42.56 Gy
Reference Point Dosage Given to Date: 10.64 Gy
Reference Point Session Dosage Given: 2.66 Gy
Session Number: 4

## 2023-02-11 ENCOUNTER — Ambulatory Visit
Admission: RE | Admit: 2023-02-11 | Discharge: 2023-02-11 | Disposition: A | Discharge status: Home / Self Care | Payer: BC Managed Care – PPO | Source: Ambulatory Visit | Attending: Radiation Oncology | Admitting: Radiation Oncology

## 2023-02-11 ENCOUNTER — Other Ambulatory Visit: Payer: Self-pay

## 2023-02-11 DIAGNOSIS — C50411 Malignant neoplasm of upper-outer quadrant of right female breast: Secondary | ICD-10-CM | POA: Diagnosis not present

## 2023-02-11 DIAGNOSIS — Z170 Estrogen receptor positive status [ER+]: Secondary | ICD-10-CM | POA: Diagnosis not present

## 2023-02-11 DIAGNOSIS — Z510 Encounter for antineoplastic radiation therapy: Secondary | ICD-10-CM | POA: Diagnosis not present

## 2023-02-11 LAB — RAD ONC ARIA SESSION SUMMARY
Course Elapsed Days: 4
Plan Fractions Treated to Date: 5
Plan Prescribed Dose Per Fraction: 2.66 Gy
Plan Total Fractions Prescribed: 16
Plan Total Prescribed Dose: 42.56 Gy
Reference Point Dosage Given to Date: 13.3 Gy
Reference Point Session Dosage Given: 2.66 Gy
Session Number: 5

## 2023-02-11 MED ORDER — SONAFINE EX EMUL
1.0000 | Freq: Once | CUTANEOUS | Status: AC
  Administered 2023-02-11: 1 via TOPICAL

## 2023-02-11 MED ORDER — ALRA NON-METALLIC DEODORANT (RAD-ONC)
1.0000 | Freq: Once | TOPICAL | Status: AC
  Administered 2023-02-11: 1 via TOPICAL

## 2023-02-14 ENCOUNTER — Ambulatory Visit
Admission: RE | Admit: 2023-02-14 | Discharge: 2023-02-14 | Disposition: A | Discharge status: Home / Self Care | Payer: BC Managed Care – PPO | Source: Ambulatory Visit | Attending: Radiation Oncology | Admitting: Radiation Oncology

## 2023-02-14 ENCOUNTER — Other Ambulatory Visit: Payer: Self-pay

## 2023-02-14 DIAGNOSIS — Z510 Encounter for antineoplastic radiation therapy: Secondary | ICD-10-CM | POA: Diagnosis not present

## 2023-02-14 DIAGNOSIS — C50411 Malignant neoplasm of upper-outer quadrant of right female breast: Secondary | ICD-10-CM | POA: Diagnosis not present

## 2023-02-14 DIAGNOSIS — Z170 Estrogen receptor positive status [ER+]: Secondary | ICD-10-CM | POA: Diagnosis not present

## 2023-02-14 LAB — RAD ONC ARIA SESSION SUMMARY
Course Elapsed Days: 7
Plan Fractions Treated to Date: 6
Plan Prescribed Dose Per Fraction: 2.66 Gy
Plan Total Fractions Prescribed: 16
Plan Total Prescribed Dose: 42.56 Gy
Reference Point Dosage Given to Date: 15.96 Gy
Reference Point Session Dosage Given: 2.66 Gy
Session Number: 6

## 2023-02-15 ENCOUNTER — Other Ambulatory Visit: Payer: Self-pay

## 2023-02-15 ENCOUNTER — Ambulatory Visit
Admission: RE | Admit: 2023-02-15 | Discharge: 2023-02-15 | Disposition: A | Discharge status: Home / Self Care | Payer: BC Managed Care – PPO | Source: Ambulatory Visit | Attending: Radiation Oncology | Admitting: Radiation Oncology

## 2023-02-15 DIAGNOSIS — C50411 Malignant neoplasm of upper-outer quadrant of right female breast: Secondary | ICD-10-CM | POA: Diagnosis not present

## 2023-02-15 DIAGNOSIS — Z510 Encounter for antineoplastic radiation therapy: Secondary | ICD-10-CM | POA: Diagnosis not present

## 2023-02-15 DIAGNOSIS — Z170 Estrogen receptor positive status [ER+]: Secondary | ICD-10-CM | POA: Diagnosis not present

## 2023-02-15 LAB — RAD ONC ARIA SESSION SUMMARY
Course Elapsed Days: 8
Plan Fractions Treated to Date: 7
Plan Prescribed Dose Per Fraction: 2.66 Gy
Plan Total Fractions Prescribed: 16
Plan Total Prescribed Dose: 42.56 Gy
Reference Point Dosage Given to Date: 18.62 Gy
Reference Point Session Dosage Given: 2.66 Gy
Session Number: 7

## 2023-02-16 ENCOUNTER — Other Ambulatory Visit: Payer: Self-pay

## 2023-02-16 ENCOUNTER — Ambulatory Visit
Admission: RE | Admit: 2023-02-16 | Discharge: 2023-02-16 | Disposition: A | Discharge status: Home / Self Care | Payer: BC Managed Care – PPO | Source: Ambulatory Visit | Attending: Radiation Oncology | Admitting: Radiation Oncology

## 2023-02-16 DIAGNOSIS — Z510 Encounter for antineoplastic radiation therapy: Secondary | ICD-10-CM | POA: Diagnosis not present

## 2023-02-16 DIAGNOSIS — Z170 Estrogen receptor positive status [ER+]: Secondary | ICD-10-CM | POA: Diagnosis not present

## 2023-02-16 DIAGNOSIS — C50411 Malignant neoplasm of upper-outer quadrant of right female breast: Secondary | ICD-10-CM | POA: Diagnosis not present

## 2023-02-16 LAB — RAD ONC ARIA SESSION SUMMARY
Course Elapsed Days: 9
Plan Fractions Treated to Date: 8
Plan Prescribed Dose Per Fraction: 2.66 Gy
Plan Total Fractions Prescribed: 16
Plan Total Prescribed Dose: 42.56 Gy
Reference Point Dosage Given to Date: 21.28 Gy
Reference Point Session Dosage Given: 2.66 Gy
Session Number: 8

## 2023-02-17 ENCOUNTER — Ambulatory Visit
Admission: RE | Admit: 2023-02-17 | Discharge: 2023-02-17 | Disposition: A | Discharge status: Home / Self Care | Payer: BC Managed Care – PPO | Source: Ambulatory Visit | Attending: Radiation Oncology | Admitting: Radiation Oncology

## 2023-02-17 ENCOUNTER — Other Ambulatory Visit: Payer: Self-pay

## 2023-02-17 DIAGNOSIS — C50411 Malignant neoplasm of upper-outer quadrant of right female breast: Secondary | ICD-10-CM | POA: Diagnosis not present

## 2023-02-17 DIAGNOSIS — Z510 Encounter for antineoplastic radiation therapy: Secondary | ICD-10-CM | POA: Diagnosis not present

## 2023-02-17 DIAGNOSIS — Z170 Estrogen receptor positive status [ER+]: Secondary | ICD-10-CM | POA: Diagnosis not present

## 2023-02-17 LAB — RAD ONC ARIA SESSION SUMMARY
Course Elapsed Days: 10
Plan Fractions Treated to Date: 9
Plan Prescribed Dose Per Fraction: 2.66 Gy
Plan Total Fractions Prescribed: 16
Plan Total Prescribed Dose: 42.56 Gy
Reference Point Dosage Given to Date: 23.94 Gy
Reference Point Session Dosage Given: 2.66 Gy
Session Number: 9

## 2023-02-18 ENCOUNTER — Ambulatory Visit
Admission: RE | Admit: 2023-02-18 | Discharge: 2023-02-18 | Disposition: A | Discharge status: Home / Self Care | Payer: BC Managed Care – PPO | Source: Ambulatory Visit | Attending: Radiation Oncology | Admitting: Radiation Oncology

## 2023-02-18 ENCOUNTER — Other Ambulatory Visit: Payer: Self-pay

## 2023-02-18 DIAGNOSIS — Z170 Estrogen receptor positive status [ER+]: Secondary | ICD-10-CM

## 2023-02-18 DIAGNOSIS — C50411 Malignant neoplasm of upper-outer quadrant of right female breast: Secondary | ICD-10-CM | POA: Diagnosis not present

## 2023-02-18 DIAGNOSIS — Z510 Encounter for antineoplastic radiation therapy: Secondary | ICD-10-CM | POA: Diagnosis not present

## 2023-02-18 LAB — RAD ONC ARIA SESSION SUMMARY
Course Elapsed Days: 11
Plan Fractions Treated to Date: 10
Plan Prescribed Dose Per Fraction: 2.66 Gy
Plan Total Fractions Prescribed: 16
Plan Total Prescribed Dose: 42.56 Gy
Reference Point Dosage Given to Date: 26.6 Gy
Reference Point Session Dosage Given: 2.66 Gy
Session Number: 10

## 2023-02-18 MED ORDER — SONAFINE EX EMUL: 1.0000 | Freq: Once | CUTANEOUS | Status: DC

## 2023-02-21 ENCOUNTER — Other Ambulatory Visit: Payer: Self-pay

## 2023-02-21 ENCOUNTER — Ambulatory Visit
Admission: RE | Admit: 2023-02-21 | Discharge: 2023-02-21 | Disposition: A | Discharge status: Home / Self Care | Payer: BC Managed Care – PPO | Source: Ambulatory Visit | Attending: Radiation Oncology | Admitting: Radiation Oncology

## 2023-02-21 DIAGNOSIS — Z170 Estrogen receptor positive status [ER+]: Secondary | ICD-10-CM | POA: Diagnosis not present

## 2023-02-21 DIAGNOSIS — Z510 Encounter for antineoplastic radiation therapy: Secondary | ICD-10-CM | POA: Diagnosis not present

## 2023-02-21 DIAGNOSIS — C50411 Malignant neoplasm of upper-outer quadrant of right female breast: Secondary | ICD-10-CM | POA: Diagnosis not present

## 2023-02-21 LAB — RAD ONC ARIA SESSION SUMMARY
Course Elapsed Days: 14
Plan Fractions Treated to Date: 11
Plan Prescribed Dose Per Fraction: 2.66 Gy
Plan Total Fractions Prescribed: 16
Plan Total Prescribed Dose: 42.56 Gy
Reference Point Dosage Given to Date: 29.26 Gy
Reference Point Session Dosage Given: 2.66 Gy
Session Number: 11

## 2023-02-22 ENCOUNTER — Other Ambulatory Visit: Payer: Self-pay

## 2023-02-22 ENCOUNTER — Encounter: Payer: Self-pay | Admitting: Radiation Oncology

## 2023-02-22 ENCOUNTER — Ambulatory Visit
Admission: RE | Admit: 2023-02-22 | Discharge: 2023-02-22 | Disposition: A | Discharge status: Home / Self Care | Payer: BC Managed Care – PPO | Source: Ambulatory Visit | Attending: Radiation Oncology | Admitting: Radiation Oncology

## 2023-02-22 DIAGNOSIS — C50411 Malignant neoplasm of upper-outer quadrant of right female breast: Secondary | ICD-10-CM | POA: Diagnosis not present

## 2023-02-22 DIAGNOSIS — Z170 Estrogen receptor positive status [ER+]: Secondary | ICD-10-CM | POA: Diagnosis not present

## 2023-02-22 DIAGNOSIS — Z510 Encounter for antineoplastic radiation therapy: Secondary | ICD-10-CM | POA: Diagnosis not present

## 2023-02-22 LAB — RAD ONC ARIA SESSION SUMMARY
Course Elapsed Days: 15
Plan Fractions Treated to Date: 12
Plan Prescribed Dose Per Fraction: 2.66 Gy
Plan Total Fractions Prescribed: 16
Plan Total Prescribed Dose: 42.56 Gy
Reference Point Dosage Given to Date: 31.92 Gy
Reference Point Session Dosage Given: 2.66 Gy
Session Number: 12

## 2023-02-23 ENCOUNTER — Ambulatory Visit
Admission: RE | Admit: 2023-02-23 | Discharge: 2023-02-23 | Disposition: A | Discharge status: Home / Self Care | Payer: BC Managed Care – PPO | Source: Ambulatory Visit | Attending: Radiation Oncology | Admitting: Radiation Oncology

## 2023-02-23 ENCOUNTER — Other Ambulatory Visit: Payer: Self-pay

## 2023-02-23 DIAGNOSIS — Z510 Encounter for antineoplastic radiation therapy: Secondary | ICD-10-CM | POA: Diagnosis not present

## 2023-02-23 DIAGNOSIS — Z170 Estrogen receptor positive status [ER+]: Secondary | ICD-10-CM | POA: Diagnosis not present

## 2023-02-23 DIAGNOSIS — C50411 Malignant neoplasm of upper-outer quadrant of right female breast: Secondary | ICD-10-CM | POA: Diagnosis not present

## 2023-02-23 LAB — RAD ONC ARIA SESSION SUMMARY
Course Elapsed Days: 16
Plan Fractions Treated to Date: 13
Plan Prescribed Dose Per Fraction: 2.66 Gy
Plan Total Fractions Prescribed: 16
Plan Total Prescribed Dose: 42.56 Gy
Reference Point Dosage Given to Date: 34.58 Gy
Reference Point Session Dosage Given: 2.66 Gy
Session Number: 13

## 2023-02-24 ENCOUNTER — Ambulatory Visit
Admission: RE | Admit: 2023-02-24 | Discharge: 2023-02-24 | Disposition: A | Discharge status: Home / Self Care | Payer: BC Managed Care – PPO | Source: Ambulatory Visit | Attending: Radiation Oncology | Admitting: Radiation Oncology

## 2023-02-24 ENCOUNTER — Other Ambulatory Visit: Payer: Self-pay

## 2023-02-24 DIAGNOSIS — C50411 Malignant neoplasm of upper-outer quadrant of right female breast: Secondary | ICD-10-CM | POA: Diagnosis not present

## 2023-02-24 DIAGNOSIS — Z510 Encounter for antineoplastic radiation therapy: Secondary | ICD-10-CM | POA: Diagnosis not present

## 2023-02-24 DIAGNOSIS — Z170 Estrogen receptor positive status [ER+]: Secondary | ICD-10-CM | POA: Diagnosis not present

## 2023-02-24 LAB — RAD ONC ARIA SESSION SUMMARY
Course Elapsed Days: 17
Plan Fractions Treated to Date: 14
Plan Prescribed Dose Per Fraction: 2.66 Gy
Plan Total Fractions Prescribed: 16
Plan Total Prescribed Dose: 42.56 Gy
Reference Point Dosage Given to Date: 37.24 Gy
Reference Point Session Dosage Given: 2.66 Gy
Session Number: 14

## 2023-02-25 ENCOUNTER — Ambulatory Visit: Payer: BC Managed Care – PPO

## 2023-02-25 ENCOUNTER — Ambulatory Visit
Admission: RE | Admit: 2023-02-25 | Payer: BC Managed Care – PPO | Source: Ambulatory Visit | Admitting: Radiation Oncology

## 2023-02-25 ENCOUNTER — Ambulatory Visit
Admission: RE | Admit: 2023-02-25 | Discharge: 2023-02-25 | Disposition: A | Discharge status: Home / Self Care | Payer: BC Managed Care – PPO | Source: Ambulatory Visit | Attending: Radiation Oncology | Admitting: Radiation Oncology

## 2023-02-25 ENCOUNTER — Other Ambulatory Visit: Payer: Self-pay

## 2023-02-25 DIAGNOSIS — Z170 Estrogen receptor positive status [ER+]: Secondary | ICD-10-CM | POA: Diagnosis not present

## 2023-02-25 DIAGNOSIS — Z510 Encounter for antineoplastic radiation therapy: Secondary | ICD-10-CM | POA: Diagnosis not present

## 2023-02-25 DIAGNOSIS — C50411 Malignant neoplasm of upper-outer quadrant of right female breast: Secondary | ICD-10-CM | POA: Diagnosis not present

## 2023-02-25 LAB — RAD ONC ARIA SESSION SUMMARY
Course Elapsed Days: 18
Plan Fractions Treated to Date: 15
Plan Prescribed Dose Per Fraction: 2.66 Gy
Plan Total Fractions Prescribed: 16
Plan Total Prescribed Dose: 42.56 Gy
Reference Point Dosage Given to Date: 39.9 Gy
Reference Point Session Dosage Given: 2.66 Gy
Session Number: 15

## 2023-02-28 ENCOUNTER — Other Ambulatory Visit: Payer: Self-pay

## 2023-02-28 ENCOUNTER — Ambulatory Visit
Admission: RE | Admit: 2023-02-28 | Discharge: 2023-02-28 | Disposition: A | Discharge status: Home / Self Care | Payer: BC Managed Care – PPO | Source: Ambulatory Visit | Attending: Radiation Oncology | Admitting: Radiation Oncology

## 2023-02-28 DIAGNOSIS — C50411 Malignant neoplasm of upper-outer quadrant of right female breast: Secondary | ICD-10-CM | POA: Diagnosis not present

## 2023-02-28 DIAGNOSIS — Z170 Estrogen receptor positive status [ER+]: Secondary | ICD-10-CM | POA: Diagnosis not present

## 2023-02-28 DIAGNOSIS — Z510 Encounter for antineoplastic radiation therapy: Secondary | ICD-10-CM | POA: Diagnosis not present

## 2023-02-28 LAB — RAD ONC ARIA SESSION SUMMARY
Course Elapsed Days: 21
Plan Fractions Treated to Date: 16
Plan Prescribed Dose Per Fraction: 2.66 Gy
Plan Total Fractions Prescribed: 16
Plan Total Prescribed Dose: 42.56 Gy
Reference Point Dosage Given to Date: 42.56 Gy
Reference Point Session Dosage Given: 2.66 Gy
Session Number: 16

## 2023-03-01 ENCOUNTER — Ambulatory Visit
Admission: RE | Admit: 2023-03-01 | Discharge: 2023-03-01 | Disposition: A | Discharge status: Home / Self Care | Payer: BC Managed Care – PPO | Source: Ambulatory Visit | Attending: Radiation Oncology | Admitting: Radiation Oncology

## 2023-03-01 ENCOUNTER — Other Ambulatory Visit: Payer: Self-pay

## 2023-03-01 DIAGNOSIS — C50411 Malignant neoplasm of upper-outer quadrant of right female breast: Secondary | ICD-10-CM | POA: Diagnosis not present

## 2023-03-01 DIAGNOSIS — Z510 Encounter for antineoplastic radiation therapy: Secondary | ICD-10-CM | POA: Diagnosis not present

## 2023-03-01 DIAGNOSIS — Z170 Estrogen receptor positive status [ER+]: Secondary | ICD-10-CM | POA: Diagnosis not present

## 2023-03-01 LAB — RAD ONC ARIA SESSION SUMMARY
Course Elapsed Days: 22
Plan Fractions Treated to Date: 1
Plan Prescribed Dose Per Fraction: 2 Gy
Plan Total Fractions Prescribed: 4
Plan Total Prescribed Dose: 8 Gy
Reference Point Dosage Given to Date: 2 Gy
Reference Point Session Dosage Given: 2 Gy
Session Number: 17

## 2023-03-02 ENCOUNTER — Ambulatory Visit
Admission: RE | Admit: 2023-03-02 | Discharge: 2023-03-02 | Disposition: A | Discharge status: Home / Self Care | Payer: BC Managed Care – PPO | Source: Ambulatory Visit | Attending: Radiation Oncology | Admitting: Radiation Oncology

## 2023-03-02 ENCOUNTER — Other Ambulatory Visit: Payer: Self-pay

## 2023-03-02 DIAGNOSIS — Z170 Estrogen receptor positive status [ER+]: Secondary | ICD-10-CM | POA: Diagnosis not present

## 2023-03-02 DIAGNOSIS — C50411 Malignant neoplasm of upper-outer quadrant of right female breast: Secondary | ICD-10-CM | POA: Diagnosis not present

## 2023-03-02 DIAGNOSIS — Z510 Encounter for antineoplastic radiation therapy: Secondary | ICD-10-CM | POA: Diagnosis not present

## 2023-03-02 LAB — RAD ONC ARIA SESSION SUMMARY
Course Elapsed Days: 23
Plan Fractions Treated to Date: 2
Plan Prescribed Dose Per Fraction: 2 Gy
Plan Total Fractions Prescribed: 4
Plan Total Prescribed Dose: 8 Gy
Reference Point Dosage Given to Date: 4 Gy
Reference Point Session Dosage Given: 2 Gy
Session Number: 18

## 2023-03-03 ENCOUNTER — Encounter: Payer: Self-pay | Admitting: *Deleted

## 2023-03-03 ENCOUNTER — Ambulatory Visit
Admission: RE | Admit: 2023-03-03 | Discharge: 2023-03-03 | Disposition: A | Discharge status: Home / Self Care | Payer: BC Managed Care – PPO | Source: Ambulatory Visit | Attending: Radiation Oncology | Admitting: Radiation Oncology

## 2023-03-03 ENCOUNTER — Other Ambulatory Visit: Payer: Self-pay

## 2023-03-03 DIAGNOSIS — Z170 Estrogen receptor positive status [ER+]: Secondary | ICD-10-CM | POA: Diagnosis not present

## 2023-03-03 DIAGNOSIS — Z510 Encounter for antineoplastic radiation therapy: Secondary | ICD-10-CM | POA: Diagnosis not present

## 2023-03-03 DIAGNOSIS — C50411 Malignant neoplasm of upper-outer quadrant of right female breast: Secondary | ICD-10-CM

## 2023-03-03 LAB — RAD ONC ARIA SESSION SUMMARY
Course Elapsed Days: 24
Plan Fractions Treated to Date: 3
Plan Prescribed Dose Per Fraction: 2 Gy
Plan Total Fractions Prescribed: 4
Plan Total Prescribed Dose: 8 Gy
Reference Point Dosage Given to Date: 6 Gy
Reference Point Session Dosage Given: 2 Gy
Session Number: 19

## 2023-03-04 ENCOUNTER — Encounter: Payer: Self-pay | Admitting: Radiation Oncology

## 2023-03-04 ENCOUNTER — Other Ambulatory Visit: Payer: Self-pay

## 2023-03-04 ENCOUNTER — Ambulatory Visit
Admission: RE | Admit: 2023-03-04 | Discharge: 2023-03-04 | Disposition: A | Discharge status: Home / Self Care | Payer: BC Managed Care – PPO | Source: Ambulatory Visit | Attending: Radiation Oncology | Admitting: Radiation Oncology

## 2023-03-04 DIAGNOSIS — Z510 Encounter for antineoplastic radiation therapy: Secondary | ICD-10-CM | POA: Diagnosis not present

## 2023-03-04 DIAGNOSIS — Z170 Estrogen receptor positive status [ER+]: Secondary | ICD-10-CM | POA: Diagnosis not present

## 2023-03-04 DIAGNOSIS — C50411 Malignant neoplasm of upper-outer quadrant of right female breast: Secondary | ICD-10-CM | POA: Diagnosis not present

## 2023-03-04 LAB — RAD ONC ARIA SESSION SUMMARY
Course Elapsed Days: 25
Plan Fractions Treated to Date: 4
Plan Prescribed Dose Per Fraction: 2 Gy
Plan Total Fractions Prescribed: 4
Plan Total Prescribed Dose: 8 Gy
Reference Point Dosage Given to Date: 8 Gy
Reference Point Session Dosage Given: 2 Gy
Session Number: 20

## 2023-03-07 ENCOUNTER — Encounter: Payer: Self-pay | Admitting: Hematology

## 2023-03-07 NOTE — Progress Notes (Signed)
  Radiation Oncology         [phone] ________________________________  Name: Karla [name] MRN: [id number]  Date: 03/04/2023  DOB: 02-Aug-1961  End of Treatment Note  Diagnosis:   Stage IA, cT1cN0M0, grade 2, ER/PR positive invasive ductal carcinoma of the right breast.   Indication for treatment:  Curative       Radiation treatment dates:   02/07/23-03/04/23  Site/dose:   The patient initially received a dose of 42.56 Gy in 16 fractions to the breast using whole-breast tangent fields. This was delivered using a 3-D conformal technique. The patient then received a boost to the seroma. This delivered an additional 8 Gy in 4 fractions using an en face electron field due to the depth of the seroma. The total dose was 50.56Gy.  Narrative: The patient tolerated radiation treatment relatively well. She developed fatigue and anticipated skin changes in the treatment field.   Plan: The patient will receive a call in about one month from the radiation oncology department. She will continue follow up with Dr. Chryl Heck as well.      Carola Rhine, PAC

## 2023-03-08 ENCOUNTER — Ambulatory Visit
Admission: RE | Admit: 2023-03-08 | Discharge: 2023-03-08 | Disposition: A | Discharge status: Home / Self Care | Payer: BC Managed Care – PPO | Source: Ambulatory Visit | Attending: Radiation Oncology | Admitting: Radiation Oncology

## 2023-03-08 ENCOUNTER — Encounter: Payer: Self-pay | Admitting: Radiation Oncology

## 2023-03-08 VITALS — BP 152/75 | HR 52 | Temp 96.9°F | Resp 18 | Ht 64.0 in | Wt 245.0 lb

## 2023-03-08 DIAGNOSIS — Z923 Personal history of irradiation: Secondary | ICD-10-CM | POA: Diagnosis not present

## 2023-03-08 DIAGNOSIS — Z79899 Other long term (current) drug therapy: Secondary | ICD-10-CM | POA: Diagnosis not present

## 2023-03-08 DIAGNOSIS — C50411 Malignant neoplasm of upper-outer quadrant of right female breast: Secondary | ICD-10-CM | POA: Diagnosis not present

## 2023-03-08 DIAGNOSIS — Z170 Estrogen receptor positive status [ER+]: Secondary | ICD-10-CM | POA: Diagnosis not present

## 2023-03-08 MED ORDER — DOXYCYCLINE HYCLATE 100 MG PO TABS: 100.0000 mg | ORAL_TABLET | Freq: Two times a day (BID) | ORAL | 0 refills | Status: AC

## 2023-03-08 MED ORDER — OXYCODONE HCL 5 MG PO TABS: ORAL_TABLET | ORAL | 0 refills | Status: AC

## 2023-03-08 NOTE — Progress Notes (Signed)
Radiation Oncology         [phone] ________________________________  Name: Karla [name] MRN: [id number]  Date of Service: 03/08/2023 DOB: 07-07-1961  Post Treatment Note  CC: Shirline Frees, MD  Shirline Frees, MD  Diagnosis:   Stage IA, cT1cN0M0, grade 2, ER/PR positive invasive ductal carcinoma of the right breast.      Radiation treatment dates:     02/07/23-03/04/23: The patient initially received a dose of 42.56 Gy in 16 fractions to the breast using whole-breast tangent fields. This was delivered using a 3-D conformal technique. The patient then received a boost to the seroma. This delivered an additional 8 Gy in 4 fractions using an en face electrons  Interval Since Last Radiation:  less than 1 week  Narrative:  The patient returns today for a skin check.  She reports that she has been experiencing terrible pain in the last few days since completing radiation.  She tried sending in pictures from [facility] however the pictures were from from a previous admission so we offered for her to come in and have in person assessment.                        On review of systems, the patient states in the last few days her right breast has become more reddened, sore, and still itching at times. Her inframammary fold and her axillary incision site are the most painful to her. She's tried Aloe gel pads, ibuprofen, and ultram without much relief. While she has not had any fever, she has noticed increased heat to the site that is more noticeable in the areas she has the most pain. This wakes her up in the middle of the early morning hours due to this pain and is described as feeling like a raw, burning, pulling sensation. She is still also using radioplex. No open wounds, drainage, or bleeding are noted. No other complaints are verbalized.   ALLERGIES:  is allergic to sodium pentobarbital [pentobarbital] and other.  Meds: Current Outpatient Medications  Medication Sig Dispense Refill    doxycycline (VIBRA-TABS) 100 MG tablet Take 1 tablet (100 mg total) by mouth 2 (two) times daily. 20 tablet 0   oxyCODONE (OXY IR/ROXICODONE) 5 MG immediate release tablet 1/2-1 tablet po q8 hours prn or qHS for breast pain 15 tablet 0   acetaminophen (TYLENOL) 500 MG tablet Take 500-1,000 mg by mouth daily as needed (headaches.).     albuterol (VENTOLIN HFA) 108 (90 Base) MCG/ACT inhaler Inhale 2 puffs into the lungs every 6 (six) hours as needed for wheezing or shortness of breath (Cough). 18 g 0   Ascorbic Acid (VITAMIN C) 1000 MG tablet Take 1,000 mg by mouth in the morning.     ergocalciferol (VITAMIN D2) 1.25 MG (50000 UT) capsule Take 50,000 Units by mouth 2 (two) times a week.     fexofenadine (ALLEGRA) 60 MG tablet Take 60 mg by mouth 2 (two) times daily as needed for allergies.     ibuprofen (ADVIL) 800 MG tablet Take 800 mg by mouth every 8 (eight) hours as needed (pain.).     Multiple Vitamin (MULTIVITAMIN WITH MINERALS) TABS tablet Take 1 tablet by mouth daily. One A Day for Women 50+     promethazine-dextromethorphan (PROMETHAZINE-DM) 6.25-15 MG/5ML syrup Take 5 mLs by mouth 3 (three) times daily as needed for cough. (Patient not taking: Reported on 12/16/2022) 100 mL 0   No current facility-administered medications for this  encounter.    Physical Findings:  height is 5\' 4"  (1.626 m) and weight is 245 lb (111.1 kg). Her temporal temperature is 96.9 F (36.1 C) (abnormal). Her blood pressure is 152/75 (abnormal) and her pulse is 52 (abnormal). Her respiration is 18 and oxygen saturation is 98%.  Pain Assessment Pain Score: 8  Pain Loc: Breast (RT)/10 In general this is a well appearing Caucasian female in no acute distress. She's alert and oriented x4 and appropriate throughout the examination. Cardiopulmonary assessment is negative for acute distress and she exhibits normal effort.  Her right breast is examined.  This reveals well-healed surgical incision sites both of her breast and  axilla. There is however erythema consistent with her radiation treatment, but more brisk erythema in the axillary region and the warmth over the breast and axillary base is concerning for early cellulitis. No fluctuance is noted.    Lab Findings: Lab Results  Component Value Date   WBC 10.4 12/08/2022   HGB 13.3 12/08/2022   HCT 39.9 12/08/2022   MCV 86.0 12/08/2022   PLT 260 12/08/2022     Radiographic Findings: No results found.  Impression/Plan: 1. Stage IA, cT1cN0M0, grade 2, ER/PR positive invasive ductal carcinoma of the right breast.  The patient appears to have anticipated skin changes from radiation but also concerns for early cellulitis which may be caused by prior itching of the skin along the axillary region. We discussed the use of silvadene for comfort topically BID, as well as Aquaphor and or Radioplex if she wishes, doxycycline to treat probable cellulitis, and oxycodone prn and at hs for pain relief in the evenings. If she develops fevers, or progressive erythema despite these interventions, she will let us know. I'll also notify Dr. Peggyann Shoals we need to revisit this when she returns on Thursday this week.        Carola Rhine, PAC

## 2023-03-08 NOTE — Progress Notes (Addendum)
Nursing interview for same day follow-up, to check the RT breast.  Diagnosis- Stage IA, cT1cN0M0, grade 2, ER/PR positive invasive ductal carcinoma of the right breast.   Patient identity verified.   Patient reports RT breast pain 8/10, w/ skin irritation, redness/ rash to the inferior portion of RT breast. No other issues conveyed at this time.  Meaningful use complete. Hysterectomy  Vitals- BP (!) 152/75 (BP Location: Left Arm, Patient Position: Sitting, Cuff Size: Normal)   Pulse (!) 52   Temp (!) 96.9 F (36.1 C) (Temporal)   Resp 18   Ht 5\' 4"  (1.626 m)   Wt 245 lb (111.1 kg)   SpO2 98%   BMI 42.05 kg/m   This concludes the interview.   Leandra Kern, LPN

## 2023-03-10 ENCOUNTER — Other Ambulatory Visit: Payer: Self-pay

## 2023-03-10 ENCOUNTER — Inpatient Hospital Stay: Payer: BC Managed Care – PPO | Attending: Hematology and Oncology | Admitting: Hematology and Oncology

## 2023-03-10 VITALS — BP 165/98 | HR 57 | Temp 97.7°F | Resp 14 | Wt 246.2 lb

## 2023-03-10 DIAGNOSIS — Z79811 Long term (current) use of aromatase inhibitors: Secondary | ICD-10-CM | POA: Insufficient documentation

## 2023-03-10 DIAGNOSIS — C50411 Malignant neoplasm of upper-outer quadrant of right female breast: Secondary | ICD-10-CM | POA: Insufficient documentation

## 2023-03-10 DIAGNOSIS — M8580 Other specified disorders of bone density and structure, unspecified site: Secondary | ICD-10-CM | POA: Diagnosis not present

## 2023-03-10 DIAGNOSIS — Z87891 Personal history of nicotine dependence: Secondary | ICD-10-CM | POA: Diagnosis not present

## 2023-03-10 DIAGNOSIS — Z170 Estrogen receptor positive status [ER+]: Secondary | ICD-10-CM | POA: Diagnosis not present

## 2023-03-10 DIAGNOSIS — Z79899 Other long term (current) drug therapy: Secondary | ICD-10-CM | POA: Diagnosis not present

## 2023-03-10 MED ORDER — ANASTROZOLE 1 MG PO TABS: 1.0000 mg | ORAL_TABLET | Freq: Every day | ORAL | 3 refills | Status: AC

## 2023-03-10 NOTE — Progress Notes (Signed)
[place] NOTE  Patient Care Team: Shirline Frees, MD as PCP - General (Family Medicine) Mauro Kaufmann, RN as Oncology Nurse Navigator Rockwell Germany, RN as Oncology Nurse Navigator Rolm Bookbinder, MD as Consulting Physician (General Surgery) Benay Pike, MD as Consulting Physician (Hematology and Oncology) Kyung Rudd, MD as Consulting Physician (Radiation Oncology)  CHIEF COMPLAINTS/PURPOSE OF CONSULTATION:  Newly diagnosed breast cancer  HISTORY OF PRESENTING ILLNESS:  Karla [name] 62 y.o. female is here because of recent diagnosis of right breast cancer  I reviewed her records extensively and collaborated the history with the patient.  SUMMARY OF ONCOLOGIC HISTORY: Oncology History  Malignant neoplasm of upper-outer quadrant of right breast in female, estrogen receptor positive  11/10/2022 Mammogram   Screening mammogram showed focal asymmetry in the right breast at 12 o clock middle depth is indeterminate.  Mass in the right breast upper outer posterior depth is indeterminate. Diagnostic mammogram  confirmed 1.1 cm irregular mass in the right breast at 10 clock post depth is indeterminate. [place] recommended. 0.8 cm oval mass in the right breast 12 0 clock depht, indeterminate. [place] recommended   11/18/2022 Breast US   Bilateral breast ultrasound confirmed the above-mentioned findings.    12/03/2022 Initial Diagnosis   Malignant neoplasm of upper-outer quadrant of right breast in female, estrogen receptor positive [facility])    Pathology Results   Right breast needle core biopsy at 1:00 6 cm from nipple showed grade 2 IDC ER/PR 100% strong positive, Ki-67 of 5% and HER2 negative.  Right breast needle core biopsy at 10:00 8 to 12 cm from the right nipple showed mucocele like lesion.  Left breast needle core biopsy at 2:00 showed fibrocystic changes, negative for malignancy.    Genetic Testing   [facility] CancerNext-Expanded Panel+RNA was  Negative. Report date is 12/27/2022.  The CancerNext-Expanded gene panel offered by [facility] and includes sequencing, rearrangement, and RNA analysis for the following 77 genes: AIP, ALK, APC, ATM, AXIN2, BAP1, BARD1, BLM, BMPR1A, BRCA1, BRCA2, BRIP1, CDC73, CDH1, CDK4, CDKN1B, CDKN2A, CHEK2, CTNNA1, DICER1, FANCC, FH, FLCN, GALNT12, KIF1B, LZTR1, MAX, MEN1, MET, MLH1, MSH2, MSH3, MSH6, MUTYH, NBN, NF1, NF2, NTHL1, PALB2, PHOX2B, PMS2, POT1, PRKAR1A, PTCH1, PTEN, RAD51C, RAD51D, RB1, RECQL, RET, SDHA, SDHAF2, SDHB, SDHC, SDHD, SMAD4, SMARCA4, SMARCB1, SMARCE1, STK11, SUFU, TMEM127, TP53, TSC1, TSC2, VHL and XRCC2 (sequencing and deletion/duplication); EGFR, EGLN1, HOXB13, KIT, MITF, PDGFRA, POLD1, and POLE (sequencing only); EPCAM and GREM1 (deletion/duplication only).     Since her last visit here she is now status post right breast lumpectomy which showed invasive ductal carcinoma with extracellular mucin measuring 9 mm, grade 2, DCIS intermediate grade, resection margins are negative for carcinoma closest is posterior margin at 4 mm.  All margins negative for invasive carcinoma, 4 out of 4 lymph nodes without any evidence of micro, micro or isolated tumor cells.  Prior prognostic showed ER 100% positive strong staining PR 100% positive strong staining HER2 negative, Ki-67 of 5% Oncotype of 3, distant recurrence risk at 9 years of 3%, no benefit from chemotherapy She is healing well. She says radiation knocked her down. She apparently did some online research, did some chatting with people who are going through breast cancer treatment and told me that she doesn't want to have nausea, vomiting or diarrhea. She doesn't want her QOL messed up. She was very reluctant to take the pill. When I asked the name of the pill, she said its called Verzenio. She is also  taking some doxycycline for infection of her breast.   MEDICAL HISTORY:  Past Medical History:  Diagnosis Date   Allergy    SEASONAL    Anemia    Asthma    Avascular necrosis    knee,left   Avascular necrosis    LEFT KNEE   Blood transfusion without reported diagnosis    Chicken pox    Childhood asthma    Easy bruising    Eczema    hands   ESR raised    ESR raised    Family history of ovarian cancer    Fibromyalgia    GERD (gastroesophageal reflux disease)    H/O gastric bypass    History of anemia    History of kidney stones    Iron deficiency    Kidney stone    Nephrolithiasis    Osteopenia    Tubular adenoma of colon    Ulcer    2004   Vitamin C deficiency    Vitamin D deficiency     SURGICAL HISTORY: Past Surgical History:  Procedure Laterality Date   ABDOMINAL HYSTERECTOMY  2000   w/opph   APPENDECTOMY     BREAST BIOPSY Right 1997   BREAST LUMPECTOMY WITH RADIOACTIVE SEED AND SENTINEL LYMPH NODE BIOPSY Right 12/23/2022   Procedure: RIGHT BREAST LUMPECTOMY WITH RADIOACTIVE SEED;  Surgeon: Rolm Bookbinder, MD;  Location: [place];  Service: General;  Laterality: Right;   CHOLECYSTECTOMY  2003   DENTAL SURGERY     All Upper Removed   GASTRIC BYPASS  2003   LEFT HEART CATHETERIZATION WITH CORONARY ANGIOGRAM N/A 11/15/2014   Procedure: LEFT HEART CATHETERIZATION WITH CORONARY ANGIOGRAM;  Surgeon: Troy Sine, MD;  Location: [facility] CATH LAB;  Service: Cardiovascular;  Laterality: N/A;   left knee replacement  01.2011   RADIOACTIVE SEED GUIDED AXILLARY SENTINEL LYMPH NODE Right 12/23/2022   Procedure: RIGHT  AXILLARY SENTINEL LYMPH NODE;  Surgeon: Rolm Bookbinder, MD;  Location: [place];  Service: General;  Laterality: Right;   RADIOACTIVE SEED GUIDED EXCISIONAL BREAST BIOPSY Right 12/23/2022   Procedure: RADIOACTIVE SEED GUIDED EXCISIONAL RIGHT BREAST  LATERAL LESION MUCOCELE BIOPSY;  Surgeon: Rolm Bookbinder, MD;  Location: [place];  Service: General;  Laterality: Right;    SOCIAL HISTORY: Social History   Socioeconomic History   Marital status: Married    Spouse name: Not on file   Number of  children: Not on file   Years of education: Not on file   Highest education level: Not on file  Occupational History   Not on file  Tobacco Use   Smoking status: Former    Types: Cigarettes    Quit date: 02/15/2012    Years since quitting: 11.0   Smokeless tobacco: Never   Tobacco comments:    quit 09/2010  Substance and Sexual Activity   Alcohol use: No   Drug use: No   Sexual activity: Yes    Birth control/protection: Surgical  Other Topics Concern   Not on file  Social History Narrative   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 Patient is on disability trying to maintain it. She won a cutody battle for a 12 year old grand daughter.   Social Determinants of Health   Financial Resource Strain: Low Risk  (12/08/2022)   Overall Financial Resource Strain (CARDIA)    Difficulty of Paying Living  Expenses: Not hard at all  Food Insecurity: No Food Insecurity (12/08/2022)   Hunger Vital Sign    Worried About Running Out of Food in the Last Year: Never true    Ran Out of Food in the Last Year: Never true  Transportation Needs: No Transportation Needs (12/08/2022)   PRAPARE - Hydrologist (Medical): No    Lack of Transportation (Non-Medical): No  Physical Activity: Not on file  Stress: Not on file  Social Connections: Not on file  Intimate Partner Violence: Not on file    FAMILY HISTORY: Family History  Problem Relation Age of Onset   Asthma Mother        Deceased   Other Mother 25       Ruptured Bowel   GER disease Mother    Coronary artery disease Mother    Rashes / Skin problems Mother    Heart disease Mother    Heart attack Mother    Esophageal cancer Mother 77 - 74       smoked   Diabetes Father        Deceased   Heart attack Father    Coronary artery disease Father    Heart disease Father    Ovarian cancer Sister 58       maternal half-sister;  "gene positive"   Healthy Brother    Ovarian cancer Maternal Aunt 22   Cancer - Other Maternal Aunt 80 - 67       "female cancer"   Breast cancer Maternal Aunt 76   Throat cancer Maternal Grandmother    Lung cancer Maternal Grandmother 71   Lung cancer Maternal Grandfather    Cancer - Other Paternal Grandmother        "female cancer"   Arthritis/Rheumatoid Daughter    Rashes / Skin problems Daughter        psoriasis   Psoriasis Daughter     ALLERGIES:  is allergic to sodium pentobarbital [pentobarbital] and other.  MEDICATIONS:  Current Outpatient Medications  Medication Sig Dispense Refill   anastrozole (ARIMIDEX) 1 MG tablet Take 1 tablet (1 mg total) by mouth daily. 90 tablet 3   acetaminophen (TYLENOL) 500 MG tablet Take 500-1,000 mg by mouth daily as needed (headaches.).     albuterol (VENTOLIN HFA) 108 (90 Base) MCG/ACT inhaler Inhale 2 puffs into the lungs every 6 (six) hours as needed for wheezing or shortness of breath (Cough). 18 g 0   Ascorbic Acid (VITAMIN C) 1000 MG tablet Take 1,000 mg by mouth in the morning.     doxycycline (VIBRA-TABS) 100 MG tablet Take 1 tablet (100 mg total) by mouth 2 (two) times daily. 20 tablet 0   ergocalciferol (VITAMIN D2) 1.25 MG (50000 UT) capsule Take 50,000 Units by mouth 2 (two) times a week.     fexofenadine (ALLEGRA) 60 MG tablet Take 60 mg by mouth 2 (two) times daily as needed for allergies.     ibuprofen (ADVIL) 800 MG tablet Take 800 mg by mouth every 8 (eight) hours as needed (pain.).     Multiple Vitamin (MULTIVITAMIN WITH MINERALS) TABS tablet Take 1 tablet by mouth daily. One A Day for Women 50+     oxyCODONE (OXY IR/ROXICODONE) 5 MG immediate release tablet 1/2-1 tablet po q8 hours prn or qHS for breast pain 15 tablet 0   promethazine-dextromethorphan (PROMETHAZINE-DM) 6.25-15 MG/5ML syrup Take 5 mLs by mouth 3 (three) times daily as needed for cough. (Patient not taking: Reported  on 12/16/2022) 100 mL 0   No current  facility-administered medications for this visit.    REVIEW OF SYSTEMS:   Constitutional: Denies fevers, chills or abnormal night sweats Eyes: Denies blurriness of vision, double vision or watery eyes Ears, nose, mouth, throat, and face: Denies mucositis or sore throat Respiratory: Denies cough, dyspnea or wheezes Cardiovascular: Denies palpitation, chest discomfort or lower extremity swelling Gastrointestinal:  Denies nausea, heartburn or change in bowel habits Skin: Denies abnormal skin rashes Lymphatics: Denies new lymphadenopathy or easy bruising Neurological:Denies numbness, tingling or new weaknesses Behavioral/Psych: Mood is stable, no new changes  Breast: Denies any palpable lumps or discharge All other systems were reviewed with the patient and are negative.  PHYSICAL EXAMINATION: ECOG PERFORMANCE STATUS: 0 - Asymptomatic  Vitals:   03/10/23 1251  BP: (!) 165/98  Pulse: (!) 57  Resp: 14  Temp: 97.7 F (36.5 C)  SpO2: 98%   Filed Weights   03/10/23 1251  Weight: 246 lb 3.2 oz (111.7 kg)    GENERAL:alert, no distress and comfortable Right breast healing well with some erythema, desquamation of her skin Rest of the pertinent 10 point ROS reviewed and neg.  LABORATORY DATA:  I have reviewed the data as listed Lab Results  Component Value Date   WBC 10.4 12/08/2022   HGB 13.3 12/08/2022   HCT 39.9 12/08/2022   MCV 86.0 12/08/2022   PLT 260 12/08/2022   Lab Results  Component Value Date   NA 139 12/08/2022   K 4.2 12/08/2022   CL 107 12/08/2022   CO2 27 12/08/2022    RADIOGRAPHIC STUDIES: I have personally reviewed the radiological reports and agreed with the findings in the report.  ASSESSMENT AND PLAN:  Malignant neoplasm of upper-outer quadrant of right breast in female, estrogen receptor positive ([place]) This is a very pleasant 43 year old likely postmenopausal, had hysterectomy 20 years ago referred to breast [place] with new diagnosis of right breast  grade 2 IDC, ER/PR 100% strong staining, Ki-67 of 5%, HER2 negative.  She also has a mucocele in the right breast at 10:00 which will be excised.  We have reviewed her findings including imaging and pathology in the breast [place].  Dr. Donne Hazel will discuss with her about the utility of MRI in the setting.  At this time since given strong ER/PR positivity and small tumor, we have discussed about upfront surgery followed by consideration for Oncotype testing.  She is here after lumpectomy, healing well.  Final pathology showed grade 2 invasive ductal carcinoma measuring 9 mm with extracellular mucin.  Previous prognostic showed ER/PR strong positivity, HER2 negative.   Oncotype DX resulted at 3, no benefit from chemo, distant risk recurrence at 9 years of 3% with antiestrogen therapy. She is now s/p adjuvant radiation. She denies any health complaints today.  She is however very worried about doing anti estrogen therapy. I have once again reassured her that we dont have any plans to start verzenio because she doesn't have high risk disease.  We recommended anti estrogen therapy with Tamoxifen or anastrozole.  I have once again discussed about adverse effects from tamoxifen or anastrozole including but not limited to postmenopausal symptoms, bone density loss, increased risk of DVT/PE with tamoxifen, endometrial hyperplasia or endometrial malignancy from tamoxifen etc.   She is interested in pursuing aromatase inhibitors, prescription dispensed to the pharmacy of her choice. She will return to clinic in months for toxicity check and survivorship visit.    Total time spent: 30 minutes including  history, physical exam, review of records, counseling and coordination of care All questions were answered. The patient knows to call the clinic with any problems, questions or concerns.    Benay Pike, MD 03/10/23

## 2023-03-10 NOTE — Assessment & Plan Note (Signed)
This is a very pleasant 62 year old likely postmenopausal, had hysterectomy 20 years ago referred to breast [place] with new diagnosis of right breast grade 2 IDC, ER/PR 100% strong staining, Ki-67 of 5%, HER2 negative.  She also has a mucocele in the right breast at 10:00 which will be excised.  We have reviewed her findings including imaging and pathology in the breast [place].  Dr. Donne Hazel will discuss with her about the utility of MRI in the setting.  At this time since given strong ER/PR positivity and small tumor, we have discussed about upfront surgery followed by consideration for Oncotype testing.  She is here after lumpectomy, healing well.  Final pathology showed grade 2 invasive ductal carcinoma measuring 9 mm with extracellular mucin.  Previous prognostic showed ER/PR strong positivity, HER2 negative.   Oncotype DX resulted at 3, no benefit from chemo, distant risk recurrence at 9 years of 3% with antiestrogen therapy. She is now s/p adjuvant radiation. She denies any health complaints today.  She is however very worried about doing anti estrogen therapy. I have once again reassured her that we dont have any plans to start verzenio because she doesn't have high risk disease.  We recommended anti estrogen therapy with Tamoxifen or anastrozole.  I have once again discussed about adverse effects from tamoxifen or anastrozole including but not limited to postmenopausal symptoms, bone density loss, increased risk of DVT/PE with tamoxifen, endometrial hyperplasia or endometrial malignancy from tamoxifen etc.   She is interested in pursuing aromatase inhibitors, prescription dispensed to the pharmacy of her choice. She will return to clinic in months for toxicity check and survivorship visit.

## 2023-03-14 ENCOUNTER — Telehealth: Payer: Self-pay | Admitting: *Deleted

## 2023-03-14 NOTE — Telephone Encounter (Signed)
Spoke with the patient to see how she was doing since she started her course of antibiotics last week.  She reports that her skin feels much better.  She states the pain has significantly decreased.  She reports continued itching and was informed this could take a couple of weeks to resolve.  She was encouraged to use some cortisone or benadryl cream.  She verbalized understanding and was appreciative of the call.  Coralyn Helling. Vickii Chafe, BSN

## 2023-03-21 ENCOUNTER — Encounter: Payer: Self-pay | Admitting: *Deleted

## 2023-03-28 ENCOUNTER — Ambulatory Visit: Payer: Self-pay

## 2023-04-25 ENCOUNTER — Ambulatory Visit
Admission: RE | Admit: 2023-04-25 | Discharge: 2023-04-25 | Disposition: A | Discharge status: Home / Self Care | Payer: BC Managed Care – PPO | Source: Ambulatory Visit | Attending: Adult Health | Admitting: Adult Health

## 2023-04-25 NOTE — Progress Notes (Signed)
  Radiation Oncology         [phone]) [phone] ________________________________  Name: Karla [name] MRN: [id number]  Date of Service: 04/25/2023  DOB: 01/21/1961  Post Treatment Telephone Note  Diagnosis:   Stage IA, [id number], grade 2, ER/PR positive invasive ductal carcinoma of the right breast.    Indication for treatment:  Curative        Radiation treatment dates:   02/07/23-03/04/23 (as documented in provider EOT note)   The patient was not available for call today. Voicemail left.  The patient was encouraged to avoid sun exposure in the area of prior treatment for up to one year following radiation with either sunscreen or by the style of clothing worn in the sun.  The patient has scheduled follow up with her medical oncologist Dr. Al Pimple for ongoing surveillance, and was encouraged to call if she develops concerns or questions regarding radiation.   Ruel Favors, LPN

## 2023-05-01 ENCOUNTER — Ambulatory Visit: Payer: Self-pay

## 2023-06-06 ENCOUNTER — Telehealth: Payer: Self-pay | Admitting: Adult Health

## 2023-06-10 ENCOUNTER — Inpatient Hospital Stay: Payer: BC Managed Care – PPO | Admitting: Adult Health

## 2023-06-14 ENCOUNTER — Encounter: Payer: Self-pay | Admitting: *Deleted

## 2023-06-14 NOTE — Progress Notes (Signed)
Called pt to see if she could reschedule the appt missed on 7/5. She made it clear she is not interested in rescheduling right now. Pt's husband has just been released from hospital with respiratory failure and she says that's more on her priority list right now than rescheduling her appointment. She would prefer we don't reach out to her . She will call us when she is ready.

## 2023-07-04 ENCOUNTER — Encounter: Payer: Self-pay | Admitting: *Deleted

## 2023-07-04 NOTE — Progress Notes (Signed)
Called to f/u with pt to inquire if she wanted to reschedule missed appt  on 7/5 since her husband is now doing better since being hospitalized with Covid pneumonia. Pt expressed that she doesn't feel the need to come and talk about any thing concerning cancer since "it's gone". I did ask how she was doing she said she is doing well and she would rather we not call her. I told her if she ever need anything we are here for her. Pt verbalized understanding.

## 2023-11-02 ENCOUNTER — Ambulatory Visit
Admission: RE | Admit: 2023-11-02 | Discharge: 2023-11-02 | Disposition: A | Discharge status: Home / Self Care | Payer: BC Managed Care – PPO | Source: Ambulatory Visit | Attending: Internal Medicine | Admitting: Internal Medicine

## 2023-11-02 VITALS — BP 139/84 | HR 63 | Temp 97.9°F | Resp 16 | Ht 65.0 in | Wt 240.0 lb

## 2023-11-02 DIAGNOSIS — J189 Pneumonia, unspecified organism: Secondary | ICD-10-CM

## 2023-11-02 DIAGNOSIS — J4520 Mild intermittent asthma, uncomplicated: Secondary | ICD-10-CM

## 2023-11-02 MED ORDER — PREDNISONE 20 MG PO TABS: ORAL_TABLET | ORAL | 0 refills | Status: AC

## 2023-11-02 MED ORDER — PROMETHAZINE-DM 6.25-15 MG/5ML PO SYRP: 5.0000 mL | ORAL_SOLUTION | Freq: Three times a day (TID) | ORAL | 0 refills | Status: AC | PRN

## 2023-11-02 MED ORDER — AMOXICILLIN-POT CLAVULANATE 875-125 MG PO TABS: 1.0000 | ORAL_TABLET | Freq: Two times a day (BID) | ORAL | 0 refills | Status: AC

## 2023-11-02 MED ORDER — AZITHROMYCIN 250 MG PO TABS: ORAL_TABLET | ORAL | 0 refills | Status: AC

## 2023-11-02 MED ORDER — ALBUTEROL SULFATE HFA 108 (90 BASE) MCG/ACT IN AERS: 1.0000 | INHALATION_SPRAY | Freq: Four times a day (QID) | RESPIRATORY_TRACT | 0 refills | Status: AC | PRN

## 2023-11-02 NOTE — ED Provider Notes (Signed)
[facility]  Note:  This document was prepared using Conservation officer, historic buildings and may include unintentional dictation errors.  MRN: [id number] DOB: [birth year]  Subjective:   Karla [name] is a 62 y.o. female presenting for 3-day history of acute onset persistent and worsening coughing, chest congestion, chest tightness, heaviness in her chest, low-grade fevers.  Has a history of intermittent asthma.  Needs a refill of her albuterol inhaler.  Patient quit smoking decades ago.  No current facility-administered medications for this encounter.  Current Outpatient Medications:    acetaminophen (TYLENOL) 500 MG tablet, Take 500-1,000 mg by mouth daily as needed (headaches.)., Disp: , Rfl:    albuterol (VENTOLIN HFA) 108 (90 Base) MCG/ACT inhaler, Inhale 2 puffs into the lungs every 6 (six) hours as needed for wheezing or shortness of breath (Cough)., Disp: 18 g, Rfl: 0   anastrozole (ARIMIDEX) 1 MG tablet, Take 1 tablet (1 mg total) by mouth daily., Disp: 90 tablet, Rfl: 3   Ascorbic Acid (VITAMIN C) 1000 MG tablet, Take 1,000 mg by mouth in the morning., Disp: , Rfl:    doxycycline (VIBRA-TABS) 100 MG tablet, Take 1 tablet (100 mg total) by mouth 2 (two) times daily., Disp: 20 tablet, Rfl: 0   ergocalciferol (VITAMIN D2) 1.25 MG (50000 UT) capsule, Take 50,000 Units by mouth 2 (two) times a week., Disp: , Rfl:    fexofenadine (ALLEGRA) 60 MG tablet, Take 60 mg by mouth 2 (two) times daily as needed for allergies., Disp: , Rfl:    ibuprofen (ADVIL) 800 MG tablet, Take 800 mg by mouth every 8 (eight) hours as needed (pain.)., Disp: , Rfl:    Multiple Vitamin (MULTIVITAMIN WITH MINERALS) TABS tablet, Take 1 tablet by mouth daily. One A Day for Women 50+, Disp: , Rfl:    oxyCODONE (OXY IR/ROXICODONE) 5 MG immediate release tablet, 1/2-1 tablet po q8 hours prn or qHS for breast pain, Disp: 15 tablet, Rfl: 0   promethazine-dextromethorphan (PROMETHAZINE-DM)  6.25-15 MG/5ML syrup, Take 5 mLs by mouth 3 (three) times daily as needed for cough. (Patient not taking: Reported on 12/16/2022), Disp: 100 mL, Rfl: 0   Allergies  Allergen Reactions   Sodium Pentobarbital [Pentobarbital] Other (See Comments) and Nausea And Vomiting    Severe nausea.   Other Nausea And Vomiting    Sodium penathol    Past Medical History:  Diagnosis Date   Allergy    SEASONAL   Anemia    Asthma    Avascular necrosis (HCC)    knee,left   Avascular necrosis (HCC)    LEFT KNEE   Blood transfusion without reported diagnosis    Chicken pox    Childhood asthma    Easy bruising    Eczema    hands   ESR raised    ESR raised    Family history of ovarian cancer    Fibromyalgia    GERD (gastroesophageal reflux disease)    H/O gastric bypass    History of anemia    History of kidney stones    Iron deficiency    Kidney stone    Nephrolithiasis    Osteopenia    Tubular adenoma of colon    Ulcer    2004   Vitamin C deficiency    Vitamin D deficiency      Past Surgical History:  Procedure Laterality Date   ABDOMINAL HYSTERECTOMY  2000   w/opph   APPENDECTOMY     BREAST BIOPSY Right  1997   BREAST LUMPECTOMY WITH RADIOACTIVE SEED AND SENTINEL LYMPH NODE BIOPSY Right 12/23/2022   Procedure: RIGHT BREAST LUMPECTOMY WITH RADIOACTIVE SEED;  Surgeon: Emelia Loron, MD;  Location: [facility] OR;  Service: General;  Laterality: Right;   CHOLECYSTECTOMY  2003   DENTAL SURGERY     All Upper Removed   GASTRIC BYPASS  2003   LEFT HEART CATHETERIZATION WITH CORONARY ANGIOGRAM N/A 11/15/2014   Procedure: LEFT HEART CATHETERIZATION WITH CORONARY ANGIOGRAM;  Surgeon: Lennette Bihari, MD;  Location: [facility] CATH LAB;  Service: Cardiovascular;  Laterality: N/A;   left knee replacement  01.2011   RADIOACTIVE SEED GUIDED AXILLARY SENTINEL LYMPH NODE Right 12/23/2022   Procedure: RIGHT  AXILLARY SENTINEL LYMPH NODE;  Surgeon: Emelia Loron, MD;  Location: [facility] OR;  Service: General;   Laterality: Right;   RADIOACTIVE SEED GUIDED EXCISIONAL BREAST BIOPSY Right 12/23/2022   Procedure: RADIOACTIVE SEED GUIDED EXCISIONAL RIGHT BREAST  LATERAL LESION MUCOCELE BIOPSY;  Surgeon: Emelia Loron, MD;  Location: [facility] OR;  Service: General;  Laterality: Right;    Family History  Problem Relation Age of Onset   Asthma Mother        Deceased   Other Mother 19       Ruptured Bowel   GER disease Mother    Coronary artery disease Mother    Rashes / Skin problems Mother    Heart disease Mother    Heart attack Mother    Esophageal cancer Mother 57 - 81       smoked   Diabetes Father        Deceased   Heart attack Father    Coronary artery disease Father    Heart disease Father    Ovarian cancer Sister 89       maternal half-sister; "gene positive"   Healthy Brother    Ovarian cancer Maternal Aunt 30   Cancer - Other Maternal Aunt 10 - 55       "female cancer"   Breast cancer Maternal Aunt 30   Throat cancer Maternal Grandmother    Lung cancer Maternal Grandmother 62   Lung cancer Maternal Grandfather    Cancer - Other Paternal Grandmother        "female cancer"   Arthritis/Rheumatoid Daughter    Rashes / Skin problems Daughter        psoriasis   Psoriasis Daughter     Social History   Tobacco Use   Smoking status: Former    Current packs/day: 0.00    Types: Cigarettes    Quit date: 02/15/2012    Years since quitting: 11.7   Smokeless tobacco: Never   Tobacco comments:    quit 09/2010  Substance Use Topics   Alcohol use: No   Drug use: No    ROS   Objective:   Vitals: BP 139/84 (BP Location: Left Arm)   Pulse 63   Temp 97.9 F (36.6 C) (Oral)   Resp 16   Ht 5\' 5"  (1.651 m)   Wt 240 lb (108.9 kg)   SpO2 97%   BMI 39.94 kg/m   Physical Exam Constitutional:      General: She is not in acute distress.    Appearance: Normal appearance. She is well-developed. She is not ill-appearing, toxic-appearing or diaphoretic.  HENT:     Head:  Normocephalic and atraumatic.     Nose: Nose normal.     Mouth/Throat:     Mouth: Mucous membranes are moist.  Eyes:  General: No scleral icterus.       Right eye: No discharge.        Left eye: No discharge.     Extraocular Movements: Extraocular movements intact.  Cardiovascular:     Rate and Rhythm: Normal rate and regular rhythm.     Heart sounds: Normal heart sounds. No murmur heard.    No friction rub. No gallop.  Pulmonary:     Effort: Pulmonary effort is normal. No respiratory distress.     Breath sounds: No stridor. Examination of the right-upper field reveals wheezing. Examination of the left-upper field reveals wheezing. Examination of the right-middle field reveals rales. Wheezing and rales present. No rhonchi.  Chest:     Chest wall: No tenderness.  Skin:    General: Skin is warm and dry.  Neurological:     General: No focal deficit present.     Mental Status: She is alert and oriented to person, place, and time.  Psychiatric:        Mood and Affect: Mood normal.        Behavior: Behavior normal.     Assessment and Plan :   PDMP not reviewed this encounter.  1. Pneumonia of right middle lobe due to infectious organism   2. Mild intermittent asthma, uncomplicated    Did not have a radiology technologist onsite.  Will treat empirically based off of exam for pneumonia of the right middle lobe; will be using double coverage with Augmentin and azithromycin.  Recommended an oral prednisone course, supportive care, albuterol. Counseled patient on potential for adverse effects with medications prescribed/recommended today, ER and return-to-clinic precautions discussed, patient verbalized understanding.    Wallis Bamberg, PA-C 11/02/23 1058

## 2023-11-02 NOTE — Discharge Instructions (Addendum)
I am managing you for pnuemonia of the right middle part of your lung with azithromycin and amoxicillin-clavulanate. Finish both antibiotic course. Use cough syrup and albuterol inhaler as needed. For the possibility of your asthma flaring up with the pnuemonia, will be using prednisone for 5 days. Follow up with your PCP and if you are not better by Sunday, come back and we'll have to do chest x-ray at that point.

## 2023-11-02 NOTE — ED Triage Notes (Signed)
Patient presents with coughing, chest congestions, low grade fevers of 101 x day 3. Treated with otc Dayquil, Nyquil without relief.

## 2024-01-17 DIAGNOSIS — C50411 Malignant neoplasm of upper-outer quadrant of right female breast: Secondary | ICD-10-CM | POA: Diagnosis not present

## 2024-01-17 DIAGNOSIS — Z170 Estrogen receptor positive status [ER+]: Secondary | ICD-10-CM | POA: Diagnosis not present

## 2024-01-31 DIAGNOSIS — Z79811 Long term (current) use of aromatase inhibitors: Secondary | ICD-10-CM | POA: Diagnosis not present

## 2024-01-31 DIAGNOSIS — Z170 Estrogen receptor positive status [ER+]: Secondary | ICD-10-CM | POA: Diagnosis not present

## 2024-01-31 DIAGNOSIS — M8580 Other specified disorders of bone density and structure, unspecified site: Secondary | ICD-10-CM | POA: Diagnosis not present

## 2024-01-31 DIAGNOSIS — C50919 Malignant neoplasm of unspecified site of unspecified female breast: Secondary | ICD-10-CM | POA: Diagnosis not present

## 2024-01-31 DIAGNOSIS — C50411 Malignant neoplasm of upper-outer quadrant of right female breast: Secondary | ICD-10-CM | POA: Diagnosis not present

## 2024-02-08 DIAGNOSIS — C50919 Malignant neoplasm of unspecified site of unspecified female breast: Secondary | ICD-10-CM | POA: Diagnosis not present

## 2024-02-08 DIAGNOSIS — M85852 Other specified disorders of bone density and structure, left thigh: Secondary | ICD-10-CM | POA: Diagnosis not present

## 2024-02-08 DIAGNOSIS — M8580 Other specified disorders of bone density and structure, unspecified site: Secondary | ICD-10-CM | POA: Diagnosis not present

## 2024-02-08 DIAGNOSIS — Z780 Asymptomatic menopausal state: Secondary | ICD-10-CM | POA: Diagnosis not present

## 2024-02-08 DIAGNOSIS — Z79811 Long term (current) use of aromatase inhibitors: Secondary | ICD-10-CM | POA: Diagnosis not present

## 2024-02-28 ENCOUNTER — Encounter: Payer: Self-pay | Admitting: Hematology
